# Patient Record
Sex: Female | Born: 1935 | Race: White | Hispanic: No | Smoking: Former smoker
Health system: Southern US, Community
[De-identification: ages and names within clinical notes are randomized; demographics above are authoritative.]

## PROBLEM LIST (undated history)

## (undated) DIAGNOSIS — Z9289 Personal history of other medical treatment: Secondary | ICD-10-CM

## (undated) DIAGNOSIS — I10 Essential (primary) hypertension: Secondary | ICD-10-CM

## (undated) DIAGNOSIS — Z87442 Personal history of urinary calculi: Secondary | ICD-10-CM

## (undated) DIAGNOSIS — F419 Anxiety disorder, unspecified: Secondary | ICD-10-CM

## (undated) DIAGNOSIS — T4145XA Adverse effect of unspecified anesthetic, initial encounter: Secondary | ICD-10-CM

## (undated) DIAGNOSIS — M1990 Unspecified osteoarthritis, unspecified site: Secondary | ICD-10-CM

## (undated) DIAGNOSIS — K589 Irritable bowel syndrome without diarrhea: Secondary | ICD-10-CM

## (undated) DIAGNOSIS — B379 Candidiasis, unspecified: Secondary | ICD-10-CM

## (undated) DIAGNOSIS — M7120 Synovial cyst of popliteal space [Baker], unspecified knee: Secondary | ICD-10-CM

## (undated) DIAGNOSIS — G2581 Restless legs syndrome: Secondary | ICD-10-CM

## (undated) DIAGNOSIS — E7800 Pure hypercholesterolemia, unspecified: Secondary | ICD-10-CM

## (undated) DIAGNOSIS — K219 Gastro-esophageal reflux disease without esophagitis: Secondary | ICD-10-CM

## (undated) DIAGNOSIS — M5126 Other intervertebral disc displacement, lumbar region: Secondary | ICD-10-CM

## (undated) DIAGNOSIS — B370 Candidal stomatitis: Secondary | ICD-10-CM

## (undated) DIAGNOSIS — R51 Headache: Secondary | ICD-10-CM

## (undated) DIAGNOSIS — T8859XA Other complications of anesthesia, initial encounter: Secondary | ICD-10-CM

## (undated) DIAGNOSIS — C801 Malignant (primary) neoplasm, unspecified: Secondary | ICD-10-CM

## (undated) HISTORY — PX: KIDNEY STONE SURGERY: SHX686

## (undated) HISTORY — PX: KNEE ARTHROSCOPY: SHX127

## (undated) HISTORY — PX: CATARACT EXTRACTION W/ INTRAOCULAR LENS  IMPLANT, BILATERAL: SHX1307

## (undated) HISTORY — PX: APPENDECTOMY: SHX54

---
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
## 1970-03-08 HISTORY — PX: ABDOMINAL HYSTERECTOMY: SHX81

## 1970-03-08 HISTORY — PX: OTHER SURGICAL HISTORY: SHX169

## 2006-03-08 HISTORY — PX: BACK SURGERY: SHX140

## 2010-03-08 HISTORY — PX: BREAST SURGERY: SHX581

## 2011-03-10 DIAGNOSIS — M722 Plantar fascial fibromatosis: Secondary | ICD-10-CM | POA: Diagnosis not present

## 2011-03-18 DIAGNOSIS — M722 Plantar fascial fibromatosis: Secondary | ICD-10-CM | POA: Diagnosis not present

## 2011-03-18 DIAGNOSIS — C50919 Malignant neoplasm of unspecified site of unspecified female breast: Secondary | ICD-10-CM | POA: Diagnosis not present

## 2011-03-18 DIAGNOSIS — Z79899 Other long term (current) drug therapy: Secondary | ICD-10-CM | POA: Diagnosis not present

## 2011-03-29 DIAGNOSIS — IMO0002 Reserved for concepts with insufficient information to code with codable children: Secondary | ICD-10-CM | POA: Diagnosis not present

## 2011-03-29 DIAGNOSIS — G603 Idiopathic progressive neuropathy: Secondary | ICD-10-CM | POA: Diagnosis not present

## 2011-03-30 DIAGNOSIS — C50419 Malignant neoplasm of upper-outer quadrant of unspecified female breast: Secondary | ICD-10-CM | POA: Diagnosis not present

## 2011-04-04 DIAGNOSIS — L03119 Cellulitis of unspecified part of limb: Secondary | ICD-10-CM | POA: Diagnosis not present

## 2011-04-06 DIAGNOSIS — L509 Urticaria, unspecified: Secondary | ICD-10-CM | POA: Diagnosis not present

## 2011-04-06 DIAGNOSIS — Z888 Allergy status to other drugs, medicaments and biological substances status: Secondary | ICD-10-CM | POA: Diagnosis not present

## 2011-04-12 DIAGNOSIS — C50419 Malignant neoplasm of upper-outer quadrant of unspecified female breast: Secondary | ICD-10-CM | POA: Diagnosis not present

## 2011-04-12 DIAGNOSIS — Z5111 Encounter for antineoplastic chemotherapy: Secondary | ICD-10-CM | POA: Diagnosis not present

## 2011-04-13 DIAGNOSIS — C50419 Malignant neoplasm of upper-outer quadrant of unspecified female breast: Secondary | ICD-10-CM | POA: Diagnosis not present

## 2011-04-13 DIAGNOSIS — Z5111 Encounter for antineoplastic chemotherapy: Secondary | ICD-10-CM | POA: Diagnosis not present

## 2011-04-14 DIAGNOSIS — M25569 Pain in unspecified knee: Secondary | ICD-10-CM | POA: Diagnosis not present

## 2011-04-14 DIAGNOSIS — M7120 Synovial cyst of popliteal space [Baker], unspecified knee: Secondary | ICD-10-CM | POA: Diagnosis not present

## 2011-04-14 DIAGNOSIS — M25469 Effusion, unspecified knee: Secondary | ICD-10-CM | POA: Diagnosis not present

## 2011-04-14 DIAGNOSIS — M1710 Unilateral primary osteoarthritis, unspecified knee: Secondary | ICD-10-CM | POA: Diagnosis not present

## 2011-04-26 DIAGNOSIS — M5137 Other intervertebral disc degeneration, lumbosacral region: Secondary | ICD-10-CM | POA: Diagnosis not present

## 2011-04-29 DIAGNOSIS — Z79899 Other long term (current) drug therapy: Secondary | ICD-10-CM | POA: Diagnosis not present

## 2011-04-29 DIAGNOSIS — M722 Plantar fascial fibromatosis: Secondary | ICD-10-CM | POA: Diagnosis not present

## 2011-05-12 DIAGNOSIS — IMO0002 Reserved for concepts with insufficient information to code with codable children: Secondary | ICD-10-CM | POA: Diagnosis not present

## 2011-05-12 DIAGNOSIS — G603 Idiopathic progressive neuropathy: Secondary | ICD-10-CM | POA: Diagnosis not present

## 2011-05-26 DIAGNOSIS — M25469 Effusion, unspecified knee: Secondary | ICD-10-CM | POA: Diagnosis not present

## 2011-05-26 DIAGNOSIS — M25569 Pain in unspecified knee: Secondary | ICD-10-CM | POA: Diagnosis not present

## 2011-05-26 DIAGNOSIS — M1710 Unilateral primary osteoarthritis, unspecified knee: Secondary | ICD-10-CM | POA: Diagnosis not present

## 2011-05-26 DIAGNOSIS — M7120 Synovial cyst of popliteal space [Baker], unspecified knee: Secondary | ICD-10-CM | POA: Diagnosis not present

## 2011-06-03 DIAGNOSIS — Z79899 Other long term (current) drug therapy: Secondary | ICD-10-CM | POA: Diagnosis not present

## 2011-06-03 DIAGNOSIS — E785 Hyperlipidemia, unspecified: Secondary | ICD-10-CM | POA: Diagnosis not present

## 2011-06-03 DIAGNOSIS — I10 Essential (primary) hypertension: Secondary | ICD-10-CM | POA: Diagnosis not present

## 2011-06-15 DIAGNOSIS — M48061 Spinal stenosis, lumbar region without neurogenic claudication: Secondary | ICD-10-CM | POA: Diagnosis not present

## 2011-06-19 DIAGNOSIS — M5126 Other intervertebral disc displacement, lumbar region: Secondary | ICD-10-CM | POA: Diagnosis not present

## 2011-06-19 DIAGNOSIS — M47817 Spondylosis without myelopathy or radiculopathy, lumbosacral region: Secondary | ICD-10-CM | POA: Diagnosis not present

## 2011-06-22 DIAGNOSIS — M48061 Spinal stenosis, lumbar region without neurogenic claudication: Secondary | ICD-10-CM | POA: Diagnosis not present

## 2011-06-25 DIAGNOSIS — M5126 Other intervertebral disc displacement, lumbar region: Secondary | ICD-10-CM | POA: Diagnosis not present

## 2011-06-25 DIAGNOSIS — IMO0002 Reserved for concepts with insufficient information to code with codable children: Secondary | ICD-10-CM | POA: Diagnosis not present

## 2011-07-07 DIAGNOSIS — IMO0002 Reserved for concepts with insufficient information to code with codable children: Secondary | ICD-10-CM | POA: Diagnosis not present

## 2011-07-07 DIAGNOSIS — G603 Idiopathic progressive neuropathy: Secondary | ICD-10-CM | POA: Diagnosis not present

## 2011-07-12 DIAGNOSIS — IMO0002 Reserved for concepts with insufficient information to code with codable children: Secondary | ICD-10-CM | POA: Diagnosis not present

## 2011-07-12 DIAGNOSIS — M5126 Other intervertebral disc displacement, lumbar region: Secondary | ICD-10-CM | POA: Diagnosis not present

## 2011-07-30 DIAGNOSIS — M5126 Other intervertebral disc displacement, lumbar region: Secondary | ICD-10-CM | POA: Diagnosis not present

## 2011-08-10 DIAGNOSIS — C50419 Malignant neoplasm of upper-outer quadrant of unspecified female breast: Secondary | ICD-10-CM | POA: Diagnosis not present

## 2011-08-18 DIAGNOSIS — G603 Idiopathic progressive neuropathy: Secondary | ICD-10-CM | POA: Diagnosis not present

## 2011-08-18 DIAGNOSIS — G4700 Insomnia, unspecified: Secondary | ICD-10-CM | POA: Diagnosis not present

## 2011-08-18 DIAGNOSIS — IMO0002 Reserved for concepts with insufficient information to code with codable children: Secondary | ICD-10-CM | POA: Diagnosis not present

## 2011-08-27 DIAGNOSIS — B370 Candidal stomatitis: Secondary | ICD-10-CM | POA: Diagnosis not present

## 2011-08-27 DIAGNOSIS — G4700 Insomnia, unspecified: Secondary | ICD-10-CM | POA: Diagnosis not present

## 2011-09-03 DIAGNOSIS — G4700 Insomnia, unspecified: Secondary | ICD-10-CM | POA: Diagnosis not present

## 2011-09-03 DIAGNOSIS — M5137 Other intervertebral disc degeneration, lumbosacral region: Secondary | ICD-10-CM | POA: Diagnosis not present

## 2011-09-03 DIAGNOSIS — I10 Essential (primary) hypertension: Secondary | ICD-10-CM | POA: Diagnosis not present

## 2011-09-03 DIAGNOSIS — R32 Unspecified urinary incontinence: Secondary | ICD-10-CM | POA: Diagnosis not present

## 2011-09-23 DIAGNOSIS — L02619 Cutaneous abscess of unspecified foot: Secondary | ICD-10-CM | POA: Diagnosis not present

## 2011-09-23 DIAGNOSIS — L97409 Non-pressure chronic ulcer of unspecified heel and midfoot with unspecified severity: Secondary | ICD-10-CM | POA: Diagnosis not present

## 2011-10-05 DIAGNOSIS — L97409 Non-pressure chronic ulcer of unspecified heel and midfoot with unspecified severity: Secondary | ICD-10-CM | POA: Diagnosis not present

## 2011-10-05 DIAGNOSIS — L02619 Cutaneous abscess of unspecified foot: Secondary | ICD-10-CM | POA: Diagnosis not present

## 2011-10-11 DIAGNOSIS — C50419 Malignant neoplasm of upper-outer quadrant of unspecified female breast: Secondary | ICD-10-CM | POA: Diagnosis not present

## 2011-10-20 DIAGNOSIS — M25569 Pain in unspecified knee: Secondary | ICD-10-CM | POA: Diagnosis not present

## 2011-10-20 DIAGNOSIS — M1710 Unilateral primary osteoarthritis, unspecified knee: Secondary | ICD-10-CM | POA: Diagnosis not present

## 2011-10-26 DIAGNOSIS — M5126 Other intervertebral disc displacement, lumbar region: Secondary | ICD-10-CM | POA: Diagnosis not present

## 2011-10-26 DIAGNOSIS — IMO0002 Reserved for concepts with insufficient information to code with codable children: Secondary | ICD-10-CM | POA: Diagnosis not present

## 2011-10-27 DIAGNOSIS — G4700 Insomnia, unspecified: Secondary | ICD-10-CM | POA: Diagnosis not present

## 2011-10-27 DIAGNOSIS — G603 Idiopathic progressive neuropathy: Secondary | ICD-10-CM | POA: Diagnosis not present

## 2011-10-27 DIAGNOSIS — IMO0002 Reserved for concepts with insufficient information to code with codable children: Secondary | ICD-10-CM | POA: Diagnosis not present

## 2011-10-28 DIAGNOSIS — Z79899 Other long term (current) drug therapy: Secondary | ICD-10-CM | POA: Diagnosis not present

## 2011-10-28 DIAGNOSIS — R609 Edema, unspecified: Secondary | ICD-10-CM | POA: Diagnosis not present

## 2011-10-29 DIAGNOSIS — Z79899 Other long term (current) drug therapy: Secondary | ICD-10-CM | POA: Diagnosis not present

## 2011-10-29 DIAGNOSIS — R609 Edema, unspecified: Secondary | ICD-10-CM | POA: Diagnosis not present

## 2011-11-11 DIAGNOSIS — I10 Essential (primary) hypertension: Secondary | ICD-10-CM | POA: Diagnosis not present

## 2011-11-11 DIAGNOSIS — Z79899 Other long term (current) drug therapy: Secondary | ICD-10-CM | POA: Diagnosis not present

## 2011-11-11 DIAGNOSIS — R946 Abnormal results of thyroid function studies: Secondary | ICD-10-CM | POA: Diagnosis not present

## 2011-11-11 DIAGNOSIS — R609 Edema, unspecified: Secondary | ICD-10-CM | POA: Diagnosis not present

## 2011-11-29 DIAGNOSIS — B372 Candidiasis of skin and nail: Secondary | ICD-10-CM | POA: Diagnosis not present

## 2011-11-29 DIAGNOSIS — R946 Abnormal results of thyroid function studies: Secondary | ICD-10-CM | POA: Diagnosis not present

## 2011-11-29 DIAGNOSIS — I10 Essential (primary) hypertension: Secondary | ICD-10-CM | POA: Diagnosis not present

## 2011-11-29 DIAGNOSIS — R609 Edema, unspecified: Secondary | ICD-10-CM | POA: Diagnosis not present

## 2011-12-01 DIAGNOSIS — R269 Unspecified abnormalities of gait and mobility: Secondary | ICD-10-CM | POA: Diagnosis not present

## 2011-12-01 DIAGNOSIS — M1710 Unilateral primary osteoarthritis, unspecified knee: Secondary | ICD-10-CM | POA: Diagnosis not present

## 2011-12-01 DIAGNOSIS — M25569 Pain in unspecified knee: Secondary | ICD-10-CM | POA: Diagnosis not present

## 2011-12-01 DIAGNOSIS — M5137 Other intervertebral disc degeneration, lumbosacral region: Secondary | ICD-10-CM | POA: Diagnosis not present

## 2011-12-02 DIAGNOSIS — R946 Abnormal results of thyroid function studies: Secondary | ICD-10-CM | POA: Diagnosis not present

## 2011-12-06 DIAGNOSIS — Z79899 Other long term (current) drug therapy: Secondary | ICD-10-CM | POA: Diagnosis not present

## 2011-12-06 DIAGNOSIS — L02419 Cutaneous abscess of limb, unspecified: Secondary | ICD-10-CM | POA: Diagnosis not present

## 2011-12-06 DIAGNOSIS — R609 Edema, unspecified: Secondary | ICD-10-CM | POA: Diagnosis not present

## 2011-12-06 DIAGNOSIS — L03119 Cellulitis of unspecified part of limb: Secondary | ICD-10-CM | POA: Diagnosis not present

## 2011-12-06 DIAGNOSIS — G4700 Insomnia, unspecified: Secondary | ICD-10-CM | POA: Diagnosis not present

## 2011-12-06 DIAGNOSIS — I10 Essential (primary) hypertension: Secondary | ICD-10-CM | POA: Diagnosis not present

## 2011-12-06 DIAGNOSIS — R946 Abnormal results of thyroid function studies: Secondary | ICD-10-CM | POA: Diagnosis not present

## 2011-12-14 DIAGNOSIS — C50419 Malignant neoplasm of upper-outer quadrant of unspecified female breast: Secondary | ICD-10-CM | POA: Diagnosis not present

## 2011-12-20 DIAGNOSIS — R1032 Left lower quadrant pain: Secondary | ICD-10-CM | POA: Diagnosis not present

## 2011-12-20 DIAGNOSIS — G4700 Insomnia, unspecified: Secondary | ICD-10-CM | POA: Diagnosis not present

## 2011-12-20 DIAGNOSIS — R609 Edema, unspecified: Secondary | ICD-10-CM | POA: Diagnosis not present

## 2011-12-20 DIAGNOSIS — I10 Essential (primary) hypertension: Secondary | ICD-10-CM | POA: Diagnosis not present

## 2011-12-23 DIAGNOSIS — M1710 Unilateral primary osteoarthritis, unspecified knee: Secondary | ICD-10-CM | POA: Diagnosis not present

## 2011-12-27 DIAGNOSIS — R1032 Left lower quadrant pain: Secondary | ICD-10-CM | POA: Diagnosis not present

## 2011-12-29 DIAGNOSIS — Z23 Encounter for immunization: Secondary | ICD-10-CM | POA: Diagnosis not present

## 2011-12-30 DIAGNOSIS — M1710 Unilateral primary osteoarthritis, unspecified knee: Secondary | ICD-10-CM | POA: Diagnosis not present

## 2012-01-03 DIAGNOSIS — G4700 Insomnia, unspecified: Secondary | ICD-10-CM | POA: Diagnosis not present

## 2012-01-03 DIAGNOSIS — R609 Edema, unspecified: Secondary | ICD-10-CM | POA: Diagnosis not present

## 2012-01-03 DIAGNOSIS — I10 Essential (primary) hypertension: Secondary | ICD-10-CM | POA: Diagnosis not present

## 2012-01-04 DIAGNOSIS — M205X9 Other deformities of toe(s) (acquired), unspecified foot: Secondary | ICD-10-CM | POA: Diagnosis not present

## 2012-01-04 DIAGNOSIS — L97509 Non-pressure chronic ulcer of other part of unspecified foot with unspecified severity: Secondary | ICD-10-CM | POA: Diagnosis not present

## 2012-01-07 DIAGNOSIS — M1710 Unilateral primary osteoarthritis, unspecified knee: Secondary | ICD-10-CM | POA: Diagnosis not present

## 2012-01-10 DIAGNOSIS — M7989 Other specified soft tissue disorders: Secondary | ICD-10-CM | POA: Diagnosis not present

## 2012-01-10 DIAGNOSIS — I872 Venous insufficiency (chronic) (peripheral): Secondary | ICD-10-CM | POA: Diagnosis not present

## 2012-03-21 DIAGNOSIS — I10 Essential (primary) hypertension: Secondary | ICD-10-CM | POA: Diagnosis not present

## 2012-03-21 DIAGNOSIS — M549 Dorsalgia, unspecified: Secondary | ICD-10-CM | POA: Diagnosis not present

## 2012-04-04 DIAGNOSIS — R21 Rash and other nonspecific skin eruption: Secondary | ICD-10-CM | POA: Diagnosis not present

## 2012-04-04 DIAGNOSIS — Z79899 Other long term (current) drug therapy: Secondary | ICD-10-CM | POA: Diagnosis not present

## 2012-04-04 DIAGNOSIS — R609 Edema, unspecified: Secondary | ICD-10-CM | POA: Diagnosis not present

## 2012-04-04 DIAGNOSIS — I10 Essential (primary) hypertension: Secondary | ICD-10-CM | POA: Diagnosis not present

## 2012-04-04 DIAGNOSIS — B370 Candidal stomatitis: Secondary | ICD-10-CM | POA: Diagnosis not present

## 2012-04-04 DIAGNOSIS — M549 Dorsalgia, unspecified: Secondary | ICD-10-CM | POA: Diagnosis not present

## 2012-04-11 DIAGNOSIS — Z853 Personal history of malignant neoplasm of breast: Secondary | ICD-10-CM | POA: Diagnosis not present

## 2012-04-11 DIAGNOSIS — Z09 Encounter for follow-up examination after completed treatment for conditions other than malignant neoplasm: Secondary | ICD-10-CM | POA: Diagnosis not present

## 2012-04-24 DIAGNOSIS — C50419 Malignant neoplasm of upper-outer quadrant of unspecified female breast: Secondary | ICD-10-CM | POA: Diagnosis not present

## 2012-04-26 DIAGNOSIS — G603 Idiopathic progressive neuropathy: Secondary | ICD-10-CM | POA: Diagnosis not present

## 2012-04-26 DIAGNOSIS — G4700 Insomnia, unspecified: Secondary | ICD-10-CM | POA: Diagnosis not present

## 2012-05-04 DIAGNOSIS — S90129A Contusion of unspecified lesser toe(s) without damage to nail, initial encounter: Secondary | ICD-10-CM | POA: Diagnosis not present

## 2012-05-09 DIAGNOSIS — C773 Secondary and unspecified malignant neoplasm of axilla and upper limb lymph nodes: Secondary | ICD-10-CM | POA: Diagnosis not present

## 2012-05-09 DIAGNOSIS — E669 Obesity, unspecified: Secondary | ICD-10-CM | POA: Diagnosis not present

## 2012-05-09 DIAGNOSIS — C801 Malignant (primary) neoplasm, unspecified: Secondary | ICD-10-CM | POA: Diagnosis not present

## 2012-05-16 DIAGNOSIS — E785 Hyperlipidemia, unspecified: Secondary | ICD-10-CM | POA: Diagnosis not present

## 2012-05-16 DIAGNOSIS — M549 Dorsalgia, unspecified: Secondary | ICD-10-CM | POA: Diagnosis not present

## 2012-05-16 DIAGNOSIS — R609 Edema, unspecified: Secondary | ICD-10-CM | POA: Diagnosis not present

## 2012-05-16 DIAGNOSIS — I10 Essential (primary) hypertension: Secondary | ICD-10-CM | POA: Diagnosis not present

## 2012-05-16 DIAGNOSIS — G4700 Insomnia, unspecified: Secondary | ICD-10-CM | POA: Diagnosis not present

## 2012-05-18 DIAGNOSIS — L03039 Cellulitis of unspecified toe: Secondary | ICD-10-CM | POA: Diagnosis not present

## 2012-05-18 DIAGNOSIS — S90129A Contusion of unspecified lesser toe(s) without damage to nail, initial encounter: Secondary | ICD-10-CM | POA: Diagnosis not present

## 2012-06-15 DIAGNOSIS — M5137 Other intervertebral disc degeneration, lumbosacral region: Secondary | ICD-10-CM | POA: Diagnosis not present

## 2012-06-15 DIAGNOSIS — G894 Chronic pain syndrome: Secondary | ICD-10-CM | POA: Diagnosis not present

## 2012-06-15 DIAGNOSIS — M1710 Unilateral primary osteoarthritis, unspecified knee: Secondary | ICD-10-CM | POA: Diagnosis not present

## 2012-06-28 DIAGNOSIS — E7800 Pure hypercholesterolemia, unspecified: Secondary | ICD-10-CM | POA: Diagnosis not present

## 2012-06-28 DIAGNOSIS — R51 Headache: Secondary | ICD-10-CM | POA: Diagnosis not present

## 2012-06-28 DIAGNOSIS — Z79899 Other long term (current) drug therapy: Secondary | ICD-10-CM | POA: Diagnosis not present

## 2012-06-28 DIAGNOSIS — I10 Essential (primary) hypertension: Secondary | ICD-10-CM | POA: Diagnosis not present

## 2012-06-29 DIAGNOSIS — I10 Essential (primary) hypertension: Secondary | ICD-10-CM | POA: Diagnosis not present

## 2012-06-29 DIAGNOSIS — M25569 Pain in unspecified knee: Secondary | ICD-10-CM | POA: Diagnosis not present

## 2012-07-03 DIAGNOSIS — M25569 Pain in unspecified knee: Secondary | ICD-10-CM | POA: Diagnosis not present

## 2012-07-03 DIAGNOSIS — M1710 Unilateral primary osteoarthritis, unspecified knee: Secondary | ICD-10-CM | POA: Diagnosis not present

## 2012-07-13 DIAGNOSIS — M25569 Pain in unspecified knee: Secondary | ICD-10-CM | POA: Diagnosis not present

## 2012-07-13 DIAGNOSIS — I10 Essential (primary) hypertension: Secondary | ICD-10-CM | POA: Diagnosis not present

## 2012-07-18 DIAGNOSIS — G4700 Insomnia, unspecified: Secondary | ICD-10-CM | POA: Diagnosis not present

## 2012-07-18 DIAGNOSIS — G603 Idiopathic progressive neuropathy: Secondary | ICD-10-CM | POA: Diagnosis not present

## 2012-07-18 DIAGNOSIS — IMO0002 Reserved for concepts with insufficient information to code with codable children: Secondary | ICD-10-CM | POA: Diagnosis not present

## 2012-07-19 DIAGNOSIS — M23359 Other meniscus derangements, posterior horn of lateral meniscus, unspecified knee: Secondary | ICD-10-CM | POA: Diagnosis not present

## 2012-07-19 DIAGNOSIS — M23349 Other meniscus derangements, anterior horn of lateral meniscus, unspecified knee: Secondary | ICD-10-CM | POA: Diagnosis not present

## 2012-07-19 DIAGNOSIS — M25569 Pain in unspecified knee: Secondary | ICD-10-CM | POA: Diagnosis not present

## 2012-07-19 DIAGNOSIS — M1710 Unilateral primary osteoarthritis, unspecified knee: Secondary | ICD-10-CM | POA: Diagnosis not present

## 2012-07-26 DIAGNOSIS — I10 Essential (primary) hypertension: Secondary | ICD-10-CM | POA: Diagnosis not present

## 2012-07-26 DIAGNOSIS — M1710 Unilateral primary osteoarthritis, unspecified knee: Secondary | ICD-10-CM | POA: Diagnosis not present

## 2012-08-16 DIAGNOSIS — Z79899 Other long term (current) drug therapy: Secondary | ICD-10-CM | POA: Diagnosis not present

## 2012-08-16 DIAGNOSIS — G4700 Insomnia, unspecified: Secondary | ICD-10-CM | POA: Diagnosis not present

## 2012-08-16 DIAGNOSIS — R079 Chest pain, unspecified: Secondary | ICD-10-CM | POA: Diagnosis not present

## 2012-08-16 DIAGNOSIS — R51 Headache: Secondary | ICD-10-CM | POA: Diagnosis not present

## 2012-08-16 DIAGNOSIS — E785 Hyperlipidemia, unspecified: Secondary | ICD-10-CM | POA: Diagnosis not present

## 2012-08-16 DIAGNOSIS — I10 Essential (primary) hypertension: Secondary | ICD-10-CM | POA: Diagnosis not present

## 2012-08-25 DIAGNOSIS — R42 Dizziness and giddiness: Secondary | ICD-10-CM | POA: Diagnosis not present

## 2012-08-25 DIAGNOSIS — R002 Palpitations: Secondary | ICD-10-CM | POA: Diagnosis not present

## 2012-08-25 DIAGNOSIS — R079 Chest pain, unspecified: Secondary | ICD-10-CM | POA: Diagnosis not present

## 2012-08-25 DIAGNOSIS — I10 Essential (primary) hypertension: Secondary | ICD-10-CM | POA: Diagnosis not present

## 2012-08-25 DIAGNOSIS — R0602 Shortness of breath: Secondary | ICD-10-CM | POA: Diagnosis not present

## 2012-08-25 DIAGNOSIS — Z79899 Other long term (current) drug therapy: Secondary | ICD-10-CM | POA: Diagnosis not present

## 2012-08-25 DIAGNOSIS — R51 Headache: Secondary | ICD-10-CM | POA: Diagnosis not present

## 2012-09-14 DIAGNOSIS — K219 Gastro-esophageal reflux disease without esophagitis: Secondary | ICD-10-CM | POA: Diagnosis not present

## 2012-09-14 DIAGNOSIS — M1710 Unilateral primary osteoarthritis, unspecified knee: Secondary | ICD-10-CM | POA: Diagnosis not present

## 2012-09-14 DIAGNOSIS — I10 Essential (primary) hypertension: Secondary | ICD-10-CM | POA: Diagnosis not present

## 2012-09-14 DIAGNOSIS — R300 Dysuria: Secondary | ICD-10-CM | POA: Diagnosis not present

## 2012-09-23 DIAGNOSIS — M1710 Unilateral primary osteoarthritis, unspecified knee: Secondary | ICD-10-CM | POA: Diagnosis not present

## 2012-09-23 DIAGNOSIS — M48061 Spinal stenosis, lumbar region without neurogenic claudication: Secondary | ICD-10-CM | POA: Diagnosis not present

## 2012-10-03 DIAGNOSIS — Z01810 Encounter for preprocedural cardiovascular examination: Secondary | ICD-10-CM | POA: Diagnosis not present

## 2012-10-03 DIAGNOSIS — I10 Essential (primary) hypertension: Secondary | ICD-10-CM | POA: Diagnosis not present

## 2012-10-09 DIAGNOSIS — Z01810 Encounter for preprocedural cardiovascular examination: Secondary | ICD-10-CM | POA: Diagnosis not present

## 2012-10-09 DIAGNOSIS — R079 Chest pain, unspecified: Secondary | ICD-10-CM | POA: Diagnosis not present

## 2012-10-18 DIAGNOSIS — M1710 Unilateral primary osteoarthritis, unspecified knee: Secondary | ICD-10-CM | POA: Diagnosis not present

## 2012-10-19 ENCOUNTER — Other Ambulatory Visit (HOSPITAL_COMMUNITY): Payer: Self-pay | Admitting: Orthopedic Surgery

## 2012-10-19 NOTE — Patient Instructions (Addendum)
[address] Lus Tangney  10/19/2012   Your procedure is scheduled on: 10-27-2012  Report to [facility] at 700 AM.  Call this number if you have problems the morning of surgery [phone]   Remember:   Do not eat food or drink liquids :After Midnight.     Take these medicines the morning of surgery with A SIP OF WATER: carvedilol, gabapentin, vicodin if needed, prevacid, pravastatin                    Do not wear jewelry, make-up or nail polish.  Do not wear lotions, powders, or perfumes. You may wear deodorant.   Men may shave face and neck.  Do not bring valuables to the hospital.   Contacts, dentures or bridgework may not be worn into surgery.  Leave suitcase in the car. After surgery it may be brought to your room.  For patients admitted to the hospital, checkout time is 11:00 AM the day of discharge.    Please read over the following fact sheets that you were given: MRSA Information, BLOOD FACT SHEET, INCENTIVE SPIROMETER FACT SHEET  Call Birdie Sons RN pre op nurse if needed [phone]    FAILURE TO FOLLOW THESE INSTRUCTIONS MAY RESULT IN THE CANCELLATION OF YOUR SURGERY.  PATIENT SIGNATURE___________________________________________  NURSE SIGNATURE_____________________________________________

## 2012-10-19 NOTE — Progress Notes (Signed)
08-25-12 [facility] ON CHART MEDICAL CLEARANCE NOTE DR CAROLINE PROCHNAU ON CHART STRESS TEST 10-09-12 [facility] ON CHART EKG 08-25-12 [facility] ON CHART

## 2012-10-20 ENCOUNTER — Encounter (HOSPITAL_COMMUNITY): Payer: Self-pay

## 2012-10-20 ENCOUNTER — Encounter (HOSPITAL_COMMUNITY): Payer: Self-pay | Admitting: Pharmacy Technician

## 2012-10-20 ENCOUNTER — Encounter (HOSPITAL_COMMUNITY)
Admission: RE | Admit: 2012-10-20 | Discharge: 2012-10-20 | Disposition: A | Discharge status: Home / Self Care | Payer: Medicare Other | Source: Ambulatory Visit | Attending: Orthopedic Surgery | Admitting: Orthopedic Surgery

## 2012-10-20 DIAGNOSIS — M1710 Unilateral primary osteoarthritis, unspecified knee: Secondary | ICD-10-CM | POA: Insufficient documentation

## 2012-10-20 DIAGNOSIS — Z01812 Encounter for preprocedural laboratory examination: Secondary | ICD-10-CM | POA: Diagnosis not present

## 2012-10-20 DIAGNOSIS — Z0183 Encounter for blood typing: Secondary | ICD-10-CM | POA: Insufficient documentation

## 2012-10-20 HISTORY — DX: Personal history of urinary calculi: Z87.442

## 2012-10-20 HISTORY — DX: Other intervertebral disc displacement, lumbar region: M51.26

## 2012-10-20 HISTORY — DX: Headache: R51

## 2012-10-20 HISTORY — DX: Candidiasis, unspecified: B37.9

## 2012-10-20 HISTORY — DX: Essential (primary) hypertension: I10

## 2012-10-20 HISTORY — DX: Pure hypercholesterolemia, unspecified: E78.00

## 2012-10-20 HISTORY — DX: Synovial cyst of popliteal space (Baker), unspecified knee: M71.20

## 2012-10-20 HISTORY — DX: Restless legs syndrome: G25.81

## 2012-10-20 HISTORY — DX: Other complications of anesthesia, initial encounter: T88.59XA

## 2012-10-20 HISTORY — DX: Gastro-esophageal reflux disease without esophagitis: K21.9

## 2012-10-20 HISTORY — DX: Unspecified osteoarthritis, unspecified site: M19.90

## 2012-10-20 HISTORY — DX: Malignant (primary) neoplasm, unspecified: C80.1

## 2012-10-20 HISTORY — DX: Anxiety disorder, unspecified: F41.9

## 2012-10-20 HISTORY — DX: Candidal stomatitis: B37.0

## 2012-10-20 HISTORY — DX: Adverse effect of unspecified anesthetic, initial encounter: T41.45XA

## 2012-10-20 LAB — COMPREHENSIVE METABOLIC PANEL WITH GFR
ALT: 18 U/L (ref 0–35)
AST: 18 U/L (ref 0–37)
Albumin: 4 g/dL (ref 3.5–5.2)
Alkaline Phosphatase: 56 U/L (ref 39–117)
BUN: 9 mg/dL (ref 6–23)
CO2: 27 meq/L (ref 19–32)
Calcium: 9.9 mg/dL (ref 8.4–10.5)
Chloride: 103 meq/L (ref 96–112)
Creatinine, Ser: 0.64 mg/dL (ref 0.50–1.10)
GFR calc Af Amer: >90 mL/min
GFR calc non Af Amer: 81 mL/min — ABNORMAL LOW
Glucose, Bld: 108 mg/dL — ABNORMAL HIGH (ref 70–99)
Potassium: 3.1 meq/L — ABNORMAL LOW (ref 3.5–5.1)
Sodium: 144 meq/L (ref 135–145)
Total Bilirubin: 0.3 mg/dL (ref 0.3–1.2)
Total Protein: 7.6 g/dL (ref 6.0–8.3)

## 2012-10-20 LAB — URINALYSIS, ROUTINE W REFLEX MICROSCOPIC
Bilirubin Urine: NEGATIVE
Glucose, UA: NEGATIVE mg/dL
Hgb urine dipstick: NEGATIVE
Ketones, ur: NEGATIVE mg/dL
Leukocytes, UA: NEGATIVE
Nitrite: NEGATIVE
Protein, ur: NEGATIVE mg/dL
Specific Gravity, Urine: 1.011 (ref 1.005–1.030)
Urobilinogen, UA: 0.2 mg/dL (ref 0.0–1.0)
pH: 7 (ref 5.0–8.0)

## 2012-10-20 LAB — CBC
Hemoglobin: 12.4 g/dL (ref 12.0–15.0)
MCH: 29.4 pg (ref 26.0–34.0)
MCHC: 32.2 g/dL (ref 30.0–36.0)
MCV: 91.2 fL (ref 78.0–100.0)
RBC: 4.22 MIL/uL (ref 3.87–5.11)

## 2012-10-20 LAB — PROTIME-INR
INR: 0.99 (ref 0.00–1.49)
Prothrombin Time: 12.9 seconds (ref 11.6–15.2)

## 2012-10-20 LAB — APTT: aPTT: 29 seconds (ref 24–37)

## 2012-10-20 LAB — ABO/RH: ABO/RH(D): O POS

## 2012-10-22 NOTE — H&P (Signed)
TOTAL KNEE ADMISSION H&P  Patient is being admitted for left total knee arthroplasty.  Subjective:  Chief Complaint:left knee pain.  HPI: Megan Page, 79 y.o. female, has a history of pain and functional disability in the left knee due to arthritis and has failed non-surgical conservative treatments for greater than 12 weeks to includeNSAID's and/or analgesics, corticosteriod injections, viscosupplementation injections, use of assistive devices and activity modification.  Onset of symptoms was gradual, starting 6 years ago with gradually worsening course since that time. The patient noted prior procedures on the knee to include  arthroscopy and menisectomy on the left knee(s).  Patient currently rates pain in the left knee(s) at 6 out of 10 with activity. Patient has night pain, worsening of pain with activity and weight bearing, pain that interferes with activities of daily living, pain with passive range of motion, crepitus and joint swelling.  Patient has evidence of periarticular osteophytes and joint space narrowing by imaging studies.  There is no active infection.   Past Medical History  Diagnosis Date  . Hypertension   . Hypercholesteremia   . Anxiety     just for surgery  . GERD (gastroesophageal reflux disease)     from medication  . Headache(784.0)   . Cancer     left breast cancer  . Arthritis     back, knees  . Ruptured lumbar disc   . History of kidney stones   . Thrush     hx of from antibiotics  . Yeast infection     hx of because of antibiotics  . Baker's cyst of knee     left knee  . Complication of anesthesia     wakes up really fast, sometimes close to end of surgery  . Restless leg syndrome     medicine controlled    Past Surgical History  Procedure Laterality Date  . Breast surgery Left 2012    lumpectomy  . Kidney stone surgery      removed with stent placement, infection after surgery  . Goiter removed  1972  . Appendectomy  at age 8  .  Abdominal hysterectomy  1972  . Back surgery  2008    "XSTOP implanted in lower back"  . Tonsillectomy  1937  . Knee arthroscopy Left 5 or 6 years ago  . Cataract extraction w/ intraocular lens  implant, bilateral      Current outpatient prescriptions: ammonium lactate (AMLACTIN) 12 % cream, Apply 5 g topically 2 (two) times daily. Lac-hydrin, Disp: , Rfl: ;  anastrozole (ARIMIDEX) 1 MG tablet, Take 1 mg by mouth daily., Disp: , Rfl: ;  calcium carbonate (OS-CAL - DOSED IN MG OF ELEMENTAL CALCIUM) 1250 MG tablet, Take 1 tablet by mouth., Disp: , Rfl: ;  carvedilol (COREG) 25 MG tablet, Take 25 mg by mouth 2 (two) times daily with a meal., Disp: , Rfl:  cholecalciferol (VITAMIN D) 1000 UNITS tablet, Take 1,000 Units by mouth daily., Disp: , Rfl: ;  diphenhydrAMINE (BENADRYL) 25 mg capsule, Take 25 mg by mouth at bedtime as needed for allergies., Disp: , Rfl: ;  fluticasone (FLONASE) 50 MCG/ACT nasal spray, Place 1 spray into the nose daily as needed for allergies., Disp: , Rfl:  gabapentin (NEURONTIN) 100 MG capsule, Take 300 mg by mouth 3 (three) times daily. Can take up to 9 capsules daily, Disp: , Rfl: ;  hydrALAZINE (APRESOLINE) 50 MG tablet, Take 50-100 mg by mouth 3 (three) times daily. Take one in the am, one at  noon, and two at betime, Disp: , Rfl: ;  HYDROcodone-acetaminophen (NORCO/VICODIN) 5-325 MG per tablet, Take 1 tablet by mouth every 6 (six) hours as needed for pain., Disp: , Rfl:  lansoprazole (PREVACID) 15 MG capsule, Take 15 mg by mouth daily., Disp: , Rfl: ;  losartan (COZAAR) 100 MG tablet, Take 100 mg by mouth every morning., Disp: , Rfl: ;  oxymetazoline (AFRIN) 0.05 % nasal spray, Place 2 sprays into the nose daily as needed for congestion., Disp: , Rfl: ;  pravastatin (PRAVACHOL) 20 MG tablet, Take 20 mg by mouth daily., Disp: , Rfl: ;  solifenacin (VESICARE) 5 MG tablet, Take 10 mg by mouth daily., Disp: , Rfl:  triamcinolone (KENALOG) 0.025 % cream, Apply 1 application topically  2 (two) times daily as needed. Apply to legs, Disp: , Rfl: ;  zolpidem (AMBIEN) 10 MG tablet, Take 10 mg by mouth at bedtime as needed for sleep., Disp: , Rfl:   Allergies  Allergen Reactions  . Celebrex [Celecoxib] Shortness Of Breath and Rash  . Sulfa Antibiotics Rash  . Aspirin     Affected platelets   . Macrolides And Ketolides Hives    History  Substance Use Topics  . Smoking status: Former Smoker -- 0.25 packs/day    Types: Cigarettes    Quit date: 03/08/1978  . Smokeless tobacco: Never Used     Comment: social smoker  . Alcohol Use: No      Review of Systems  Constitutional: Negative.   HENT: Negative for hearing loss, ear pain, nosebleeds, congestion, sore throat, neck pain, tinnitus and ear discharge.   Eyes: Negative.   Respiratory: Positive for shortness of breath. Negative for cough, hemoptysis, sputum production, wheezing and stridor.        SOB with exertion  Cardiovascular: Negative.   Gastrointestinal: Positive for constipation. Negative for heartburn, nausea, vomiting, abdominal pain, diarrhea, blood in stool and melena.  Genitourinary: Positive for frequency. Negative for dysuria, urgency, hematuria and flank pain.  Musculoskeletal: Positive for back pain and joint pain. Negative for myalgias and falls.  Skin: Positive for rash. Negative for itching.  Neurological: Positive for headaches. Negative for dizziness, tingling, tremors, sensory change, speech change, focal weakness, seizures and loss of consciousness.  Endo/Heme/Allergies: Negative.   Psychiatric/Behavioral: Negative for depression, suicidal ideas, hallucinations, memory loss and substance abuse. The patient has insomnia. The patient is not nervous/anxious.     Objective:  Physical Exam  Constitutional: She is oriented to person, place, and time. She appears well-developed and well-nourished. No distress.  HENT:  Head: Normocephalic and atraumatic.  Right Ear: External ear normal.  Left Ear:  External ear normal.  Nose: Nose normal.  Mouth/Throat: Oropharynx is clear and moist.  Eyes: Conjunctivae and EOM are normal.  Neck: Normal range of motion. Neck supple.  Cardiovascular: Normal rate, regular rhythm, normal heart sounds and intact distal pulses.   No murmur heard. Respiratory: Effort normal and breath sounds normal. No respiratory distress. She has no wheezes.  GI: Soft. Bowel sounds are normal. She exhibits no distension and no mass. There is no tenderness.  Musculoskeletal:       Right hip: Normal.       Left hip: Normal.       Right knee: She exhibits decreased range of motion. She exhibits no swelling, no effusion and no erythema. Tenderness found. Medial joint line and lateral joint line tenderness noted.       Left knee: She exhibits decreased range of motion and swelling.  She exhibits no effusion and no erythema. Tenderness found. Medial joint line and lateral joint line tenderness noted.       Lumbar back: She exhibits decreased range of motion and pain.       Right lower leg: She exhibits no tenderness and no swelling.       Left lower leg: She exhibits no tenderness and no swelling.  Lymphadenopathy:    She has no cervical adenopathy.  Neurological: She is alert and oriented to person, place, and time. She has normal strength and normal reflexes. No sensory deficit.  Skin: No rash noted. She is not diaphoretic. No erythema.  Psychiatric: She has a normal mood and affect. Her behavior is normal.    Vitals  HR: 76 bpm BP: 150/85 (Sitting, Left Arm, Standard)  Imaging Review Plain radiographs demonstrate severe degenerative joint disease of the left knee(s). The overall alignment ismild valgus. The bone quality appears to be fair for age and reported activity level.  Assessment/Plan:  End stage arthritis, left knee   The patient history, physical examination, clinical judgment of the provider and imaging studies are consistent with end stage degenerative  joint disease of the left knee(s) and total knee arthroplasty is deemed medically necessary. The treatment options including medical management, injection therapy arthroscopy and arthroplasty were discussed at length. The risks and benefits of total knee arthroplasty were presented and reviewed. The risks due to aseptic loosening, infection, stiffness, patella tracking problems, thromboembolic complications and other imponderables were discussed. The patient acknowledged the explanation, agreed to proceed with the plan and consent was signed. Patient is being admitted for inpatient treatment for surgery, pain control, PT, OT, prophylactic antibiotics, VTE prophylaxis, progressive ambulation and ADL's and discharge planning. The patient is planning to be discharged to skilled nursing facility ([facility])   Dimitri Ped, [place]

## 2012-10-23 DIAGNOSIS — Z09 Encounter for follow-up examination after completed treatment for conditions other than malignant neoplasm: Secondary | ICD-10-CM | POA: Diagnosis not present

## 2012-10-23 DIAGNOSIS — Z853 Personal history of malignant neoplasm of breast: Secondary | ICD-10-CM | POA: Diagnosis not present

## 2012-10-27 ENCOUNTER — Encounter (HOSPITAL_COMMUNITY)
Admission: RE | Disposition: A | Discharge status: Skilled Nursing Facility | Payer: Self-pay | Source: Ambulatory Visit | Attending: Orthopedic Surgery

## 2012-10-27 ENCOUNTER — Encounter (HOSPITAL_COMMUNITY): Payer: Self-pay | Admitting: *Deleted

## 2012-10-27 ENCOUNTER — Inpatient Hospital Stay (HOSPITAL_COMMUNITY): Payer: Medicare Other

## 2012-10-27 ENCOUNTER — Encounter (HOSPITAL_COMMUNITY): Payer: Self-pay | Admitting: Anesthesiology

## 2012-10-27 ENCOUNTER — Inpatient Hospital Stay (HOSPITAL_COMMUNITY): Payer: Medicare Other | Admitting: Anesthesiology

## 2012-10-27 ENCOUNTER — Inpatient Hospital Stay (HOSPITAL_COMMUNITY)
Admission: RE | Admit: 2012-10-27 | Discharge: 2012-10-31 | DRG: 470 | Disposition: A | Discharge status: Skilled Nursing Facility | Payer: Medicare Other | Source: Ambulatory Visit | Attending: Orthopedic Surgery | Admitting: Orthopedic Surgery

## 2012-10-27 DIAGNOSIS — Z87891 Personal history of nicotine dependence: Secondary | ICD-10-CM | POA: Diagnosis not present

## 2012-10-27 DIAGNOSIS — I10 Essential (primary) hypertension: Secondary | ICD-10-CM | POA: Diagnosis present

## 2012-10-27 DIAGNOSIS — Z01812 Encounter for preprocedural laboratory examination: Secondary | ICD-10-CM | POA: Diagnosis not present

## 2012-10-27 DIAGNOSIS — G2581 Restless legs syndrome: Secondary | ICD-10-CM | POA: Diagnosis present

## 2012-10-27 DIAGNOSIS — D62 Acute posthemorrhagic anemia: Secondary | ICD-10-CM | POA: Diagnosis not present

## 2012-10-27 DIAGNOSIS — M1710 Unilateral primary osteoarthritis, unspecified knee: Principal | ICD-10-CM | POA: Diagnosis present

## 2012-10-27 DIAGNOSIS — Z79899 Other long term (current) drug therapy: Secondary | ICD-10-CM

## 2012-10-27 DIAGNOSIS — IMO0002 Reserved for concepts with insufficient information to code with codable children: Secondary | ICD-10-CM | POA: Diagnosis not present

## 2012-10-27 DIAGNOSIS — F411 Generalized anxiety disorder: Secondary | ICD-10-CM | POA: Diagnosis present

## 2012-10-27 DIAGNOSIS — E7800 Pure hypercholesterolemia, unspecified: Secondary | ICD-10-CM | POA: Diagnosis not present

## 2012-10-27 DIAGNOSIS — M24569 Contracture, unspecified knee: Secondary | ICD-10-CM | POA: Diagnosis not present

## 2012-10-27 DIAGNOSIS — M1712 Unilateral primary osteoarthritis, left knee: Secondary | ICD-10-CM

## 2012-10-27 DIAGNOSIS — M25569 Pain in unspecified knee: Secondary | ICD-10-CM | POA: Diagnosis not present

## 2012-10-27 DIAGNOSIS — K219 Gastro-esophageal reflux disease without esophagitis: Secondary | ICD-10-CM | POA: Diagnosis present

## 2012-10-27 DIAGNOSIS — Z471 Aftercare following joint replacement surgery: Secondary | ICD-10-CM | POA: Diagnosis not present

## 2012-10-27 DIAGNOSIS — Z96659 Presence of unspecified artificial knee joint: Secondary | ICD-10-CM | POA: Diagnosis not present

## 2012-10-27 DIAGNOSIS — S8990XA Unspecified injury of unspecified lower leg, initial encounter: Secondary | ICD-10-CM | POA: Diagnosis not present

## 2012-10-27 DIAGNOSIS — Y831 Surgical operation with implant of artificial internal device as the cause of abnormal reaction of the patient, or of later complication, without mention of misadventure at the time of the procedure: Secondary | ICD-10-CM | POA: Diagnosis not present

## 2012-10-27 HISTORY — PX: TOTAL KNEE ARTHROPLASTY: SHX125

## 2012-10-27 SURGERY — ARTHROPLASTY, KNEE, TOTAL
Anesthesia: General | Site: Knee | Laterality: Left | Wound class: Clean

## 2012-10-27 MED ORDER — PROMETHAZINE HCL 25 MG/ML IJ SOLN: 6.2500 mg | INTRAMUSCULAR | Status: DC | PRN

## 2012-10-27 MED ORDER — HYDROMORPHONE HCL 2 MG PO TABS
2.0000 mg | ORAL_TABLET | ORAL | Status: DC | PRN
  Administered 2012-10-27 – 2012-10-29 (×11): 2 mg via ORAL
  Filled 2012-10-27 (×12): qty 1

## 2012-10-27 MED ORDER — HYDROMORPHONE HCL PF 1 MG/ML IJ SOLN
1.0000 mg | INTRAMUSCULAR | Status: DC | PRN
  Administered 2012-10-27 (×2): 1 mg via INTRAVENOUS
  Filled 2012-10-27 (×2): qty 1

## 2012-10-27 MED ORDER — FLEET ENEMA 7-19 GM/118ML RE ENEM
1.0000 | ENEMA | Freq: Once | RECTAL | Status: AC | PRN
  Filled 2012-10-27: qty 1

## 2012-10-27 MED ORDER — HYDRALAZINE HCL 50 MG PO TABS
50.0000 mg | ORAL_TABLET | Freq: Two times a day (BID) | ORAL | Status: DC
  Filled 2012-10-27 (×2): qty 1

## 2012-10-27 MED ORDER — SODIUM CHLORIDE 0.9 % IJ SOLN
INTRAMUSCULAR | Status: DC | PRN
  Administered 2012-10-27: 11:00:00

## 2012-10-27 MED ORDER — THROMBIN 5000 UNITS EX SOLR
CUTANEOUS | Status: DC | PRN
  Administered 2012-10-27: 10000 [IU] via TOPICAL

## 2012-10-27 MED ORDER — HYDROCODONE-ACETAMINOPHEN 5-325 MG PO TABS
1.0000 | ORAL_TABLET | ORAL | Status: DC | PRN
  Administered 2012-10-30 – 2012-10-31 (×6): 1 via ORAL
  Filled 2012-10-27 (×7): qty 1

## 2012-10-27 MED ORDER — SIMVASTATIN 5 MG PO TABS
5.0000 mg | ORAL_TABLET | Freq: Every day | ORAL | Status: DC
  Administered 2012-10-27 – 2012-10-30 (×4): 5 mg via ORAL
  Filled 2012-10-27 (×6): qty 1

## 2012-10-27 MED ORDER — CEFAZOLIN SODIUM-DEXTROSE 2-3 GM-% IV SOLR
2.0000 g | INTRAVENOUS | Status: AC
  Administered 2012-10-27: 2 g via INTRAVENOUS

## 2012-10-27 MED ORDER — ONDANSETRON HCL 4 MG PO TABS
4.0000 mg | ORAL_TABLET | Freq: Four times a day (QID) | ORAL | Status: DC | PRN
  Administered 2012-10-28: 4 mg via ORAL
  Filled 2012-10-27 (×3): qty 1

## 2012-10-27 MED ORDER — ONDANSETRON HCL 4 MG/2ML IJ SOLN
4.0000 mg | Freq: Four times a day (QID) | INTRAMUSCULAR | Status: DC | PRN
  Administered 2012-10-28 – 2012-10-31 (×2): 4 mg via INTRAVENOUS
  Filled 2012-10-27: qty 2

## 2012-10-27 MED ORDER — HYDROMORPHONE HCL PF 1 MG/ML IJ SOLN
INTRAMUSCULAR | Status: DC | PRN
  Administered 2012-10-27 (×2): 1 mg via INTRAVENOUS

## 2012-10-27 MED ORDER — ONDANSETRON HCL 4 MG/2ML IJ SOLN
INTRAMUSCULAR | Status: DC | PRN
  Administered 2012-10-27: 4 mg via INTRAVENOUS

## 2012-10-27 MED ORDER — ANASTROZOLE 1 MG PO TABS
1.0000 mg | ORAL_TABLET | Freq: Every day | ORAL | Status: DC
  Administered 2012-10-27 – 2012-10-31 (×5): 1 mg via ORAL
  Filled 2012-10-27 (×7): qty 1

## 2012-10-27 MED ORDER — PROPOFOL 10 MG/ML IV BOLUS
INTRAVENOUS | Status: DC | PRN
  Administered 2012-10-27: 100 mg via INTRAVENOUS

## 2012-10-27 MED ORDER — ACETAMINOPHEN 650 MG RE SUPP
650.0000 mg | Freq: Four times a day (QID) | RECTAL | Status: DC | PRN
  Filled 2012-10-27: qty 1

## 2012-10-27 MED ORDER — LACTATED RINGERS IV SOLN
INTRAVENOUS | Status: DC
  Administered 2012-10-27 (×2): via INTRAVENOUS

## 2012-10-27 MED ORDER — GLYCOPYRROLATE 0.2 MG/ML IJ SOLN
INTRAMUSCULAR | Status: DC | PRN
  Administered 2012-10-27: .4 mg via INTRAVENOUS

## 2012-10-27 MED ORDER — FERROUS SULFATE 325 (65 FE) MG PO TABS
325.0000 mg | ORAL_TABLET | Freq: Three times a day (TID) | ORAL | Status: DC
  Administered 2012-10-28 – 2012-10-31 (×10): 325 mg via ORAL
  Filled 2012-10-27 (×14): qty 1

## 2012-10-27 MED ORDER — BISACODYL 10 MG RE SUPP
10.0000 mg | Freq: Every day | RECTAL | Status: DC | PRN
  Administered 2012-10-31: 10 mg via RECTAL
  Filled 2012-10-27 (×3): qty 1

## 2012-10-27 MED ORDER — RIVAROXABAN 10 MG PO TABS
10.0000 mg | ORAL_TABLET | Freq: Every day | ORAL | Status: DC
Start: 2012-10-28 — End: 2012-10-31
  Administered 2012-10-28 – 2012-10-31 (×4): 10 mg via ORAL
  Filled 2012-10-27 (×5): qty 1

## 2012-10-27 MED ORDER — CARVEDILOL 25 MG PO TABS
25.0000 mg | ORAL_TABLET | Freq: Two times a day (BID) | ORAL | Status: DC
  Administered 2012-10-27 – 2012-10-31 (×6): 25 mg via ORAL
  Filled 2012-10-27 (×11): qty 1

## 2012-10-27 MED ORDER — HYDROMORPHONE HCL PF 1 MG/ML IJ SOLN
0.2500 mg | INTRAMUSCULAR | Status: DC | PRN
  Administered 2012-10-27 (×2): 0.5 mg via INTRAVENOUS

## 2012-10-27 MED ORDER — FENTANYL CITRATE 0.05 MG/ML IJ SOLN
25.0000 ug | INTRAMUSCULAR | Status: DC | PRN
  Administered 2012-10-27 (×3): 50 ug via INTRAVENOUS

## 2012-10-27 MED ORDER — LABETALOL HCL 5 MG/ML IV SOLN
INTRAVENOUS | Status: DC | PRN
  Administered 2012-10-27 (×2): 5 mg via INTRAVENOUS

## 2012-10-27 MED ORDER — DEXAMETHASONE SODIUM PHOSPHATE 10 MG/ML IJ SOLN
INTRAMUSCULAR | Status: DC | PRN
  Administered 2012-10-27: 10 mg via INTRAVENOUS

## 2012-10-27 MED ORDER — LACTATED RINGERS IV SOLN
INTRAVENOUS | Status: DC
  Administered 2012-10-27: 100 mL/h via INTRAVENOUS
  Administered 2012-10-28: 01:00:00 via INTRAVENOUS

## 2012-10-27 MED ORDER — METOCLOPRAMIDE HCL 5 MG/ML IJ SOLN
5.0000 mg | Freq: Four times a day (QID) | INTRAMUSCULAR | Status: DC | PRN
  Administered 2012-10-27 – 2012-10-28 (×2): 10 mg via INTRAVENOUS
  Filled 2012-10-27 (×2): qty 2

## 2012-10-27 MED ORDER — DARIFENACIN HYDROBROMIDE ER 7.5 MG PO TB24
7.5000 mg | ORAL_TABLET | Freq: Every day | ORAL | Status: DC
  Administered 2012-10-29 – 2012-10-31 (×3): 7.5 mg via ORAL
  Filled 2012-10-27 (×6): qty 1

## 2012-10-27 MED ORDER — GABAPENTIN 300 MG PO CAPS
300.0000 mg | ORAL_CAPSULE | Freq: Three times a day (TID) | ORAL | Status: DC
  Administered 2012-10-27 – 2012-10-31 (×12): 300 mg via ORAL
  Filled 2012-10-27 (×15): qty 1

## 2012-10-27 MED ORDER — DEXTROSE 5 % IV SOLN
500.0000 mg | Freq: Four times a day (QID) | INTRAVENOUS | Status: DC | PRN
  Administered 2012-10-27: 500 mg via INTRAVENOUS
  Filled 2012-10-27: qty 5

## 2012-10-27 MED ORDER — MENTHOL 3 MG MT LOZG
1.0000 | LOZENGE | OROMUCOSAL | Status: DC | PRN
  Administered 2012-10-27: 3 mg via ORAL
  Filled 2012-10-27: qty 9

## 2012-10-27 MED ORDER — CEFAZOLIN SODIUM 1-5 GM-% IV SOLN
1.0000 g | Freq: Four times a day (QID) | INTRAVENOUS | Status: AC
Start: 2012-10-27 — End: 2012-10-27
  Administered 2012-10-27 (×2): 1 g via INTRAVENOUS
  Filled 2012-10-27 (×2): qty 50

## 2012-10-27 MED ORDER — ALUM & MAG HYDROXIDE-SIMETH 200-200-20 MG/5ML PO SUSP
30.0000 mL | ORAL | Status: DC | PRN
  Filled 2012-10-27: qty 30

## 2012-10-27 MED ORDER — SODIUM CHLORIDE 0.9 % IR SOLN
Status: DC | PRN
  Administered 2012-10-27: 10:00:00

## 2012-10-27 MED ORDER — LIDOCAINE HCL (PF) 2 % IJ SOLN
INTRAMUSCULAR | Status: DC | PRN
  Administered 2012-10-27: 40 mg

## 2012-10-27 MED ORDER — FLUTICASONE PROPIONATE 50 MCG/ACT NA SUSP
1.0000 | Freq: Every day | NASAL | Status: DC | PRN
  Filled 2012-10-27: qty 16

## 2012-10-27 MED ORDER — PHENOL 1.4 % MT LIQD: 1.0000 | OROMUCOSAL | Status: DC | PRN

## 2012-10-27 MED ORDER — ROCURONIUM BROMIDE 100 MG/10ML IV SOLN
INTRAVENOUS | Status: DC | PRN
  Administered 2012-10-27: 30 mg via INTRAVENOUS

## 2012-10-27 MED ORDER — ACETAMINOPHEN 325 MG PO TABS
650.0000 mg | ORAL_TABLET | Freq: Four times a day (QID) | ORAL | Status: DC | PRN
  Administered 2012-10-29 – 2012-10-30 (×2): 650 mg via ORAL
  Filled 2012-10-27 (×2): qty 2

## 2012-10-27 MED ORDER — EPHEDRINE SULFATE 50 MG/ML IJ SOLN
INTRAMUSCULAR | Status: DC | PRN
  Administered 2012-10-27: 5 mg via INTRAVENOUS
  Administered 2012-10-27: 10 mg via INTRAVENOUS

## 2012-10-27 MED ORDER — HYDRALAZINE HCL 50 MG PO TABS
100.0000 mg | ORAL_TABLET | Freq: Every day | ORAL | Status: DC
  Administered 2012-10-27: 100 mg via ORAL
  Administered 2012-10-28: 50 mg via ORAL
  Administered 2012-10-29 – 2012-10-30 (×2): 100 mg via ORAL
  Filled 2012-10-27 (×5): qty 2

## 2012-10-27 MED ORDER — HYDRALAZINE HCL 50 MG PO TABS
50.0000 mg | ORAL_TABLET | Freq: Two times a day (BID) | ORAL | Status: DC
  Administered 2012-10-28 – 2012-10-31 (×7): 50 mg via ORAL
  Filled 2012-10-27 (×10): qty 1

## 2012-10-27 MED ORDER — METHOCARBAMOL 500 MG PO TABS
500.0000 mg | ORAL_TABLET | Freq: Four times a day (QID) | ORAL | Status: DC | PRN
  Administered 2012-10-27 – 2012-10-31 (×7): 500 mg via ORAL
  Filled 2012-10-27 (×7): qty 1

## 2012-10-27 MED ORDER — SODIUM CHLORIDE 0.9 % IR SOLN
Status: DC | PRN
  Administered 2012-10-27: 1000 mL

## 2012-10-27 MED ORDER — PANTOPRAZOLE SODIUM 20 MG PO TBEC
20.0000 mg | DELAYED_RELEASE_TABLET | Freq: Every day | ORAL | Status: DC
  Administered 2012-10-27 – 2012-10-31 (×5): 20 mg via ORAL
  Filled 2012-10-27 (×6): qty 1

## 2012-10-27 MED ORDER — SUCCINYLCHOLINE CHLORIDE 20 MG/ML IJ SOLN
INTRAMUSCULAR | Status: DC | PRN
  Administered 2012-10-27: 100 mg via INTRAVENOUS

## 2012-10-27 MED ORDER — POLYETHYLENE GLYCOL 3350 17 G PO PACK
17.0000 g | PACK | Freq: Every day | ORAL | Status: DC | PRN
  Administered 2012-10-28 – 2012-10-31 (×3): 17 g via ORAL
  Filled 2012-10-27: qty 1

## 2012-10-27 MED ORDER — NEOSTIGMINE METHYLSULFATE 1 MG/ML IJ SOLN
INTRAMUSCULAR | Status: DC | PRN
  Administered 2012-10-27: 3 mg via INTRAVENOUS

## 2012-10-27 MED ORDER — FENTANYL CITRATE 0.05 MG/ML IJ SOLN
INTRAMUSCULAR | Status: DC | PRN
  Administered 2012-10-27: 100 ug via INTRAVENOUS

## 2012-10-27 MED ORDER — BUPIVACAINE LIPOSOME 1.3 % IJ SUSP
20.0000 mL | Freq: Once | INTRAMUSCULAR | Status: DC
  Filled 2012-10-27: qty 20

## 2012-10-27 MED ORDER — MEPERIDINE HCL 50 MG/ML IJ SOLN: 6.2500 mg | INTRAMUSCULAR | Status: DC | PRN

## 2012-10-27 MED ORDER — LOSARTAN POTASSIUM 50 MG PO TABS
100.0000 mg | ORAL_TABLET | Freq: Every morning | ORAL | Status: DC
  Administered 2012-10-28 – 2012-10-31 (×3): 100 mg via ORAL
  Filled 2012-10-27 (×5): qty 2

## 2012-10-27 MED ORDER — MIDAZOLAM HCL 5 MG/5ML IJ SOLN
INTRAMUSCULAR | Status: DC | PRN
  Administered 2012-10-27 (×2): 1 mg via INTRAVENOUS

## 2012-10-27 SURGICAL SUPPLY — 75 items
BAG ZIPLOCK 12X15 (MISCELLANEOUS) ×2 IMPLANT
BANDAGE ELASTIC 4 VELCRO ST LF (GAUZE/BANDAGES/DRESSINGS) ×2 IMPLANT
BANDAGE ELASTIC 6 VELCRO ST LF (GAUZE/BANDAGES/DRESSINGS) ×2 IMPLANT
BANDAGE ESMARK 6X9 LF (GAUZE/BANDAGES/DRESSINGS) ×1 IMPLANT
BLADE SAG 18X100X1.27 (BLADE) ×2 IMPLANT
BLADE SAW SGTL 11.0X1.19X90.0M (BLADE) ×2 IMPLANT
BNDG COHESIVE 4X5 TAN NS LF (GAUZE/BANDAGES/DRESSINGS) IMPLANT
BNDG ESMARK 6X9 LF (GAUZE/BANDAGES/DRESSINGS) ×2
BONE CEMENT GENTAMICIN (Cement) ×4 IMPLANT
CAPT RP KNEE ×2 IMPLANT
CEMENT BONE GENTAMICIN 40 (Cement) ×2 IMPLANT
CLOTH BEACON ORANGE TIMEOUT ST (SAFETY) ×2 IMPLANT
CUFF TOURN SGL QUICK 34 (TOURNIQUET CUFF) ×1
CUFF TRNQT CYL 34X4X40X1 (TOURNIQUET CUFF) ×1 IMPLANT
DERMABOND ADVANCED (GAUZE/BANDAGES/DRESSINGS)
DERMABOND ADVANCED .7 DNX12 (GAUZE/BANDAGES/DRESSINGS) IMPLANT
DRAPE EXTREMITY T 121X128X90 (DRAPE) ×2 IMPLANT
DRAPE INCISE IOBAN 66X45 STRL (DRAPES) ×2 IMPLANT
DRAPE LG THREE QUARTER DISP (DRAPES) ×2 IMPLANT
DRAPE POUCH INSTRU U-SHP 10X18 (DRAPES) ×2 IMPLANT
DRAPE U-SHAPE 47X51 STRL (DRAPES) ×2 IMPLANT
DRSG ADAPTIC 3X8 NADH LF (GAUZE/BANDAGES/DRESSINGS) ×2 IMPLANT
DRSG AQUACEL AG ADV 3.5X10 (GAUZE/BANDAGES/DRESSINGS) IMPLANT
DRSG PAD ABDOMINAL 8X10 ST (GAUZE/BANDAGES/DRESSINGS) ×2 IMPLANT
DRSG TEGADERM 4X4.75 (GAUZE/BANDAGES/DRESSINGS) ×2 IMPLANT
DURAPREP 26ML APPLICATOR (WOUND CARE) ×2 IMPLANT
ELECT REM PT RETURN 9FT ADLT (ELECTROSURGICAL) ×2
ELECTRODE REM PT RTRN 9FT ADLT (ELECTROSURGICAL) ×1 IMPLANT
EVACUATOR 1/8 PVC DRAIN (DRAIN) ×2 IMPLANT
GAUZE SPONGE 2X2 8PLY STRL LF (GAUZE/BANDAGES/DRESSINGS) ×1 IMPLANT
GLOVE BIOGEL PI IND STRL 7.0 (GLOVE) ×1 IMPLANT
GLOVE BIOGEL PI IND STRL 7.5 (GLOVE) ×1 IMPLANT
GLOVE BIOGEL PI IND STRL 8 (GLOVE) ×2 IMPLANT
GLOVE BIOGEL PI INDICATOR 7.0 (GLOVE) ×1
GLOVE BIOGEL PI INDICATOR 7.5 (GLOVE) ×1
GLOVE BIOGEL PI INDICATOR 8 (GLOVE) ×2
GLOVE ECLIPSE 8.0 STRL XLNG CF (GLOVE) ×8 IMPLANT
GLOVE SURG SS PI 6.5 STRL IVOR (GLOVE) ×2 IMPLANT
GLOVE SURG SS PI 7.0 STRL IVOR (GLOVE) ×2 IMPLANT
GOWN PREVENTION PLUS LG XLONG (DISPOSABLE) IMPLANT
GOWN STRL NON-REIN LRG LVL3 (GOWN DISPOSABLE) ×2 IMPLANT
GOWN STRL REIN XL XLG (GOWN DISPOSABLE) ×6 IMPLANT
HANDPIECE INTERPULSE COAX TIP (DISPOSABLE) ×1
IMMOBILIZER KNEE 20 (SOFTGOODS) ×2
IMMOBILIZER KNEE 20 THIGH 36 (SOFTGOODS) ×1 IMPLANT
KIT BASIN OR (CUSTOM PROCEDURE TRAY) ×2 IMPLANT
MANIFOLD NEPTUNE II (INSTRUMENTS) ×2 IMPLANT
NEEDLE HYPO 22GX1.5 SAFETY (NEEDLE) ×2 IMPLANT
NS IRRIG 1000ML POUR BTL (IV SOLUTION) IMPLANT
PACK TOTAL JOINT (CUSTOM PROCEDURE TRAY) ×2 IMPLANT
PADDING CAST COTTON 6X4 STRL (CAST SUPPLIES) ×2 IMPLANT
POSITIONER SURGICAL ARM (MISCELLANEOUS) ×2 IMPLANT
SET HNDPC FAN SPRY TIP SCT (DISPOSABLE) ×1 IMPLANT
SPONGE GAUZE 2X2 STER 10/PKG (GAUZE/BANDAGES/DRESSINGS) ×1
SPONGE GAUZE 4X4 12PLY (GAUZE/BANDAGES/DRESSINGS) ×2 IMPLANT
SPONGE LAP 18X18 X RAY DECT (DISPOSABLE) IMPLANT
SPONGE SURGIFOAM ABS GEL 100 (HEMOSTASIS) ×2 IMPLANT
STAPLER VISISTAT 35W (STAPLE) ×2 IMPLANT
SUT BONE WAX W31G (SUTURE) ×2 IMPLANT
SUT MNCRL AB 4-0 PS2 18 (SUTURE) ×2 IMPLANT
SUT VIC AB 0 CT1 27 (SUTURE) ×1
SUT VIC AB 0 CT1 27XBRD ANTBC (SUTURE) ×1 IMPLANT
SUT VIC AB 1 CT1 27 (SUTURE) ×4
SUT VIC AB 1 CT1 27XBRD ANTBC (SUTURE) ×4 IMPLANT
SUT VIC AB 1 CT1 36 (SUTURE) IMPLANT
SUT VIC AB 2-0 CT1 27 (SUTURE) ×2
SUT VIC AB 2-0 CT1 TAPERPNT 27 (SUTURE) ×2 IMPLANT
SUT VLOC 180 0 24IN GS25 (SUTURE) ×2 IMPLANT
SYR 20CC LL (SYRINGE) ×2 IMPLANT
TOWEL OR 17X26 10 PK STRL BLUE (TOWEL DISPOSABLE) ×4 IMPLANT
TOWER CARTRIDGE SMART MIX (DISPOSABLE) ×2 IMPLANT
TRAY FOLEY CATH 14FRSI W/METER (CATHETERS) ×2 IMPLANT
WATER STERILE IRR 1500ML POUR (IV SOLUTION) ×4 IMPLANT
WRAP KNEE MAXI GEL POST OP (GAUZE/BANDAGES/DRESSINGS) ×4 IMPLANT
YANKAUER SUCT BULB TIP NO VENT (SUCTIONS) ×2 IMPLANT

## 2012-10-27 NOTE — Anesthesia Preprocedure Evaluation (Addendum)
Anesthesia Evaluation  Patient identified by MRN, date of birth, ID band Patient awake    Reviewed: Allergy & Precautions, H&P , NPO status , Patient's Chart, lab work & pertinent test results  Airway Mallampati: II TM Distance: >3 FB Neck ROM: Full    Dental no notable dental hx.    Pulmonary neg pulmonary ROS,  breath sounds clear to auscultation  Pulmonary exam normal       Cardiovascular hypertension, Pt. on medications Rhythm:Regular Rate:Normal     Neuro/Psych negative neurological ROS  negative psych ROS   GI/Hepatic negative GI ROS, Neg liver ROS,   Endo/Other  negative endocrine ROS  Renal/GU negative Renal ROS  negative genitourinary   Musculoskeletal negative musculoskeletal ROS (+)   Abdominal   Peds negative pediatric ROS (+)  Hematology negative hematology ROS (+)   Anesthesia Other Findings Multiple crowns   Reproductive/Obstetrics negative OB ROS                           Anesthesia Physical Anesthesia Plan  ASA: II  Anesthesia Plan: General   Post-op Pain Management:    Induction: Intravenous  Airway Management Planned: Oral ETT  Additional Equipment:   Intra-op Plan:   Post-operative Plan: Extubation in OR  Informed Consent: I have reviewed the patients History and Physical, chart, labs and discussed the procedure including the risks, benefits and alternatives for the proposed anesthesia with the patient or authorized representative who has indicated his/her understanding and acceptance.   Dental advisory given  Plan Discussed with: CRNA  Anesthesia Plan Comments:        Anesthesia Quick Evaluation

## 2012-10-27 NOTE — Progress Notes (Signed)
PT Cancellation Note  Patient Details Name: Megan Page MRN: [id number] DOB: [birth year]   Cancelled Treatment:     POD 0 eval refused by pt 2* "my leg hurts".  Will follow in am.   [name] 10/27/2012, 3:45 PM

## 2012-10-27 NOTE — Preoperative (Signed)
Beta Blockers   Reason not to administer Beta Blockers:Not Applicable, Took beta blocker this AM

## 2012-10-27 NOTE — Interval H&P Note (Signed)
History and Physical Interval Note:  10/27/2012 9:27 AM  Megan Page  has presented today for surgery, with the diagnosis of OA LEFT KNEE  The various methods of treatment have been discussed with the patient and family. After consideration of risks, benefits and other options for treatment, the patient has consented to  Procedure(s): TOTAL KNEE ARTHROPLASTY (Left) as a surgical intervention .  The patient's history has been reviewed, patient examined, no change in status, stable for surgery.  I have reviewed the patient's chart and labs.  Questions were answered to the patient's satisfaction.     [name] A

## 2012-10-27 NOTE — Transfer of Care (Signed)
Immediate Anesthesia Transfer of Care Note  Patient: Megan Page  Procedure(s) Performed: Procedure(s) (LRB): TOTAL KNEE ARTHROPLASTY (Left)  Patient Location: PACU  Anesthesia Type: General  Level of Consciousness: sedated, patient cooperative and responds to stimulaton  Airway & Oxygen Therapy: Patient Spontanous Breathing and Patient connected to face mask oxgen  Post-op Assessment: Report given to PACU RN and Post -op Vital signs reviewed and stable  Post vital signs: Reviewed and stable  Complications: No apparent anesthesia complications

## 2012-10-27 NOTE — Progress Notes (Signed)
Portable AP and Lateral Left Knee X-rays done. 

## 2012-10-27 NOTE — Progress Notes (Signed)
X-ray results noted 

## 2012-10-27 NOTE — Op Note (Signed)
NAMEPIERRETTE, SCHEU NO.:  [id number]  MEDICAL RECORD NO.:  [id number]  LOCATION:  [facility]                         FACILITY:  [facility]  PHYSICIAN:  Georges Lynch. Latondra Gebhart, M.D.DATE OF BIRTH:  [birth year]  DATE OF PROCEDURE:  10/27/2012 DATE OF DISCHARGE:                              OPERATIVE REPORT   SURGEON:  Georges Lynch. Darrelyn Hillock, M.D.  ASSISTANT:  Marlowe Kays, MD.  PREOPERATIVE DIAGNOSIS:  Osteoarthritis with bone-on-bone with a flexion contracture, left knee.  POSTOPERATIVE DIAGNOSIS:  Osteoarthritis with bone-on-bone with a flexion contracture, left knee.  OPERATION:  Left total knee arthroplasty.  The DePuy system was used. All 3 components were cemented and gentamicin was used in the cement. The sizes used was a size 3 left posterior cruciate sacrificing femoral component, tibial tray was a size 3.  The insert was a size 3 and 12.5 mm thickness.  The patella was a size 38.  Note all 3 components were cemented.  PROCEDURE IN DETAIL:  Under general anesthesia, routine orthopedic prep and draping of the left lower extremity was carried out.  The appropriate time-out was first carried out.  I also marked the appropriate left leg in the holding area.  At this time, the leg was exsanguinated with an Esmarch.  Tourniquet was elevated to 325 mmHg. The knee was flexed and incision was made over the anterior aspect of the left knee.  Bleeders were identified and cauterized.  Two flaps were created.  I then carried out a median parapatellar incision and reflected the patella laterally.  We did anterior-posterior cruciate resection and excised the medial and lateral menisci.  Following that, my initial drill hole was made in the intercondylar notch.  I then inserted the canal finder.  I then irrigated out the femoral canal, 12 mm thickness was taken off the distal femur with the first jig. Following that, we measured the femur to be a size 3, and we did a appropriate  anterior, posterior, and chamfering cuts for a size 3 left femur.  After that, we measured the tray on the tibial side to be a size 3.  We removed 6 mm thickness off the affected medial side, which was actually more depressed from the lateral side.  After that, I inserted a laminar spreaders and checked for posterior osteophytes.  We then inserted our spacer blocks and I elected to use a 12.5 mm thickness tibial spacer.  After that we irrigated out the knee and then made my appropriate keel cut in the tibia.  I then cut my notch cut out of the distal femur.  Trial components were inserted.  We first went with a 10 mm thickness and felt that the 12.5 mm thickness insert was the best. So, once the knee was held in extension, I then did a resurfacing procedure on the patella in the usual fashion.  Three drill holes were made in the patella for a 38 mm patella.  All trial components were removed.  I thoroughly water picked out the knee, and then inserted all 3 components after the knee was dried out.  We cemented all 3 components with gentamicin and cement.  Once the cement was hardened, we thoroughly removed  all loose pieces of cement and water picked out the knee, and I then injected a portion of our mixture of 20 mL of Exparel with 20 mL of normal saline.  We then inserted our permanent tibial tray as a rotating platform, size 3 and 12.5 mm thickness.  The knee was reduced.  We had excellent function and excellent medial and lateral stability.  I then inserted a Hemovac drain and closed the wound layers in usual fashion. Skin was closed with metal staples.  Sterile Neosporin dressing was applied.  Prior to closing the wound, I injected the remaining portion of the mixture of saline and Exparel.  The patient left the operating room in satisfactory condition with a sterile dressing and Hemovac in place.  She had 2 g of IV Ancef preop.          ______________________________ Georges Lynch.  Darrelyn Hillock, M.D.     RAG/MEDQ  D:  10/27/2012  T:  10/27/2012  Job:  [id number]

## 2012-10-27 NOTE — Care Management Note (Signed)
    Page 1 of 1   10/27/2012     4:59:13 PM   CARE MANAGEMENT NOTE 10/27/2012  Patient:  Megan Page, Megan Page   Account Number:  [id number]  Date Initiated:  10/27/2012  Documentation initiated by:  Colleen Can  Subjective/Objective Assessment:   dx total knee replacemnt     Action/Plan:   SNF rehab-wants [facility] in [place]   Anticipated DC Date:  10/30/2012   Anticipated DC Plan:  SKILLED NURSING FACILITY      DC Planning Services  CM consult      Choice offered to / List presented to:             Status of service:  Completed, signed off Medicare Important Message given?  NA - LOS <3 / Initial given by admissions (If response is "NO", the following Medicare IM given date fields will be blank) Date Medicare IM given:   Date Additional Medicare IM given:    Discharge Disposition:    Per UR Regulation:    If discussed at Long Length of Stay Meetings, dates discussed:    Comments:

## 2012-10-27 NOTE — Brief Op Note (Signed)
10/27/2012  11:45 AM  PATIENT:  Megan Page  79 y.o. female  PRE-OPERATIVE DIAGNOSIS:  OA LEFT KNEE  POST-OPERATIVE DIAGNOSIS:  OA LEFT KNEE  PROCEDURE:  Procedure(s): TOTAL KNEE ARTHROPLASTY (Left)  SURGEON:  Surgeon(s) and Role:    * Jacki Cones, MD - Primary    * Drucilla Schmidt, MD - Assisting   ASSISTANTS:James Aplington MD   ANESTHESIA:   general  EBL:  Total I/O In: 1000 [I.V.:1000] Out: 250 [Urine:200; Blood:50]  BLOOD ADMINISTERED:none  DRAINS: (One) Hemovact drain(s) in the Left Knee with  Suction Open   LOCAL MEDICATIONS USED:  BUPIVICAINE 20cc mixed with 20cc Normal Saline   SPECIMEN:  No Specimen  DISPOSITION OF SPECIMEN:  No Specimen  COUNTS:  YES  TOURNIQUET:   Total Tourniquet Time Documented: Thigh (Left) - 96 minutes Total: Thigh (Left) - 96 minutes   DICTATION: .Other Dictation: Dictation Number [id number]  PLAN OF CARE: Admit to inpatient   PATIENT DISPOSITION:  PACU - hemodynamically stable.   Delay start of Pharmacological VTE agent (>24hrs) due to surgical blood loss or risk of bleeding: yes

## 2012-10-27 NOTE — Anesthesia Postprocedure Evaluation (Signed)
  Anesthesia Post-op Note  Patient: Megan Page  Procedure(s) Performed: Procedure(s) (LRB): TOTAL KNEE ARTHROPLASTY (Left)  Patient Location: PACU  Anesthesia Type: General  Level of Consciousness: awake and alert   Airway and Oxygen Therapy: Patient Spontanous Breathing  Post-op Pain: mild  Post-op Assessment: Post-op Vital signs reviewed, Patient's Cardiovascular Status Stable, Respiratory Function Stable, Patent Airway and No signs of Nausea or vomiting  Last Vitals:  Filed Vitals:   10/27/12 1600  BP: 142/87  Pulse: 77  Temp: 36.6 C  Resp: 15    Post-op Vital Signs: stable   Complications: No apparent anesthesia complications

## 2012-10-28 LAB — CBC
HCT: 28.3 % — ABNORMAL LOW (ref 36.0–46.0)
Hemoglobin: 9.2 g/dL — ABNORMAL LOW (ref 12.0–15.0)
WBC: 13.1 10*3/uL — ABNORMAL HIGH (ref 4.0–10.5)

## 2012-10-28 LAB — BASIC METABOLIC PANEL
BUN: 10 mg/dL (ref 6–23)
Chloride: 104 mEq/L (ref 96–112)
Glucose, Bld: 141 mg/dL — ABNORMAL HIGH (ref 70–99)
Potassium: 3.6 mEq/L (ref 3.5–5.1)

## 2012-10-28 MED ORDER — HYDROCODONE-ACETAMINOPHEN 5-325 MG PO TABS: 1.0000 | ORAL_TABLET | ORAL | Status: DC | PRN

## 2012-10-28 MED ORDER — ZOLPIDEM TARTRATE 5 MG PO TABS
5.0000 mg | ORAL_TABLET | Freq: Every evening | ORAL | Status: DC | PRN
  Administered 2012-10-28 – 2012-10-30 (×3): 5 mg via ORAL
  Filled 2012-10-28 (×3): qty 1

## 2012-10-28 MED ORDER — RIVAROXABAN 10 MG PO TABS: 10.0000 mg | ORAL_TABLET | Freq: Every day | ORAL | Status: DC

## 2012-10-28 MED ORDER — ZOLPIDEM TARTRATE 10 MG PO TABS: 10.0000 mg | ORAL_TABLET | Freq: Every evening | ORAL | Status: DC | PRN

## 2012-10-28 MED ORDER — METHOCARBAMOL 500 MG PO TABS
500.0000 mg | ORAL_TABLET | Freq: Four times a day (QID) | ORAL | Status: DC | PRN
Start: 2012-10-28 — End: 2013-08-31

## 2012-10-28 NOTE — Progress Notes (Signed)
Physical Therapy Treatment Patient Details Name: Megan Page MRN: [id number] DOB: [birth year] Today's Date: 10/28/2012 Time: 1030-1040 PT Time Calculation (min): 10 min  PT Assessment / Plan / Recommendation  History of Present Illness Megan Page, 79 y.o. female, has a history of pain and functional disability in the left knee due to arthritis and has failed non-surgical conservative treatments for greater than 12 weeks to includeNSAID's and/or analgesics, corticosteriod injections, viscosupplementation injections, use of assistive devices and activity modification.  Onset of symptoms was gradual, starting 6 years ago with gradually worsening course since that time. The patient noted prior procedures on the knee to include  arthroscopy and menisectomy on the left knee(s).   PT Comments   Pt reports she thinks she will feels better after she gets some sleep  Follow Up Recommendations  SNF     Does the patient have the potential to tolerate intense rehabilitation     Barriers to Discharge        Equipment Recommendations  None recommended by PT    Recommendations for Other Services    Frequency 7X/week   Progress towards PT Goals Progress towards PT goals: Progressing toward goals  Plan Current plan remains appropriate    Precautions / Restrictions Precautions Precautions: Knee Required Braces or Orthoses: Knee Immobilizer - Left Knee Immobilizer - Left: On when out of bed or walking Restrictions Weight Bearing Restrictions: No (WBAT)   Pertinent Vitals/Pain Pain in left leg    Mobility  Bed Mobility Bed Mobility: Sit to Supine Supine to Sit: 1: +2 Total assist Supine to Sit: Patient Percentage: 50% Sit to Supine: 1: +2 Total assist Sit to Supine: Patient Percentage: 50% Details for Bed Mobility Assistance: pt able to use right leg to bridge to edge of bed and assit with pulling up to sitting Transfers Transfers: Sit to Stand;Stand to Sit Sit to Stand: 1: +2  Total assist Stand to Sit: 1: +2 Total assist Stand to Sit: Patient Percentage: 50% Details for Transfer Assistance: pt limited by pain in LLE, moves very slowly, tends to keep eyes closed Ambulation/Gait Ambulation/Gait Assistance: 1: +2 Total assist Ambulation/Gait: Patient Percentage: 50% Ambulation Distance (Feet): 8 Feet Assistive device: Rolling walker Ambulation/Gait Assistance Details: cues for sequence Gait Pattern: Step-to pattern;Decreased step length - right;Decreased step length - left Gait velocity: slow General Gait Details: better able to weight shift, advance left leg  and bear weight on it. Stairs: No Wheelchair Mobility Wheelchair Mobility: No    Exercises Total Joint Exercises Ankle Circles/Pumps: AROM;Both;10 reps;Supine Quad Sets: AROM;Left;10 reps;Supine Short Arc Quad: AAROM;Left;5 reps;Supine Hip ABduction/ADduction: AAROM;Left;5 reps;Supine Straight Leg Raises: AAROM;Left;5 reps;Supine Knee Flexion: AAROM;Left;5 reps Goniometric ROM: ~60 degrees   PT Diagnosis: Difficulty walking  PT Problem List: Decreased strength;Decreased range of motion;Decreased activity tolerance;Decreased mobility;Pain PT Treatment Interventions: DME instruction;Gait training;Functional mobility training;Therapeutic activities;Therapeutic exercise;Balance training;Patient/family education   PT Goals (current goals can now be found in the care plan section) Acute Rehab PT Goals Patient Stated Goal: to go to [facility] for rehab PT Goal Formulation: With patient Time For Goal Achievement: 11/11/12 Potential to Achieve Goals: Good  Visit Information  Last PT Received On: 10/28/12 Assistance Needed: +2 History of Present Illness: Megan Page, 79 y.o. female, has a history of pain and functional disability in the left knee due to arthritis and has failed non-surgical conservative treatments for greater than 12 weeks to includeNSAID's and/or analgesics, corticosteriod injections,  viscosupplementation injections, use of assistive devices and activity modification.  Onset of symptoms was  gradual, starting 6 years ago with gradually worsening course since that time. The patient noted prior procedures on the knee to include  arthroscopy and menisectomy on the left knee(s).    Subjective Data  Patient Stated Goal: to go to [facility] for rehab   Cognition  Cognition Arousal/Alertness: Awake/alert Behavior During Therapy: WFL for tasks assessed/performed Overall Cognitive Status: Within Functional Limits for tasks assessed    Balance  Balance Balance Assessed: Yes Static Sitting Balance Static Sitting - Balance Support: Bilateral upper extremity supported;Feet supported Static Sitting - Level of Assistance: 5: Stand by assistance Static Standing Balance Static Standing - Balance Support: Bilateral upper extremity supported;During functional activity Static Standing - Level of Assistance: 5: Stand by assistance  End of Session PT - End of Session Equipment Utilized During Treatment: Gait belt Activity Tolerance: Patient limited by pain;Patient limited by fatigue Patient left: in bed;with family/visitor present Nurse Communication: Mobility status   GP    Bayard Hugger. Manson Passey, [place] [phone] 10/28/2012, 10:58 AM

## 2012-10-28 NOTE — Evaluation (Signed)
Occupational Therapy Evaluation Patient Details Name: Megan Page MRN: [id number] DOB: [birth year] Today's Date: 10/28/2012 Time: 1450-1510 OT Time Calculation (min): 20 min  OT Assessment / Plan / Recommendation History of present illness Megan Page, 79 y.o. female, has a history of pain and functional disability in the left knee due to arthritis and has failed non-surgical conservative treatments for greater than 12 weeks to includeNSAID's and/or analgesics, corticosteriod injections, viscosupplementation injections, use of assistive devices and activity modification.  Onset of symptoms was gradual, starting 6 years ago with gradually worsening course since that time. The patient noted prior procedures on the knee to include  arthroscopy and menisectomy on the left knee(s).   Clinical Impression   Pt requires +2 assist for mobility OOB, this being POD 1.  Began education in ADL and AE use.  Pt lives alone and will need post acute rehab in a SNF.  Prefers a SNF near her home in [place].  Will defer OT to SNF.    OT Assessment  All further OT needs can be met in the next venue of care    Follow Up Recommendations  SNF    Barriers to Discharge      Equipment Recommendations  None recommended by OT    Recommendations for Other Services    Frequency       Precautions / Restrictions Precautions Precautions: Knee;Fall Required Braces or Orthoses: Knee Immobilizer - Left Knee Immobilizer - Left: On when out of bed or walking Restrictions Weight Bearing Restrictions: No   Pertinent Vitals/Pain L knee 6/10, iced, repositioned, RN notified    ADL  Eating/Feeding: Independent Where Assessed - Eating/Feeding: Bed level Grooming: Wash/dry hands;Set up Where Assessed - Grooming: Unsupported sitting Upper Body Bathing: Set up Where Assessed - Upper Body Bathing: Unsupported sitting Lower Body Bathing: Maximal assistance Where Assessed - Lower Body Bathing: Unsupported  sitting;Supported sit to stand Upper Body Dressing: Set up Where Assessed - Upper Body Dressing: Unsupported sitting Lower Body Dressing: Maximal assistance Where Assessed - Lower Body Dressing: Supported sitting;Unsupported standing Toilet Transfer: +2 Total assistance Toilet Transfer: Patient Percentage: 50% Toilet Transfer Method: Stand pivot Toilet Transfer Equipment: Bedside commode Toileting - Clothing Manipulation and Hygiene: Supervision/safety Where Assessed - Toileting Clothing Manipulation and Hygiene: Sit on 3-in-1 or toilet Equipment Used: Gait belt;Knee Immobilizer;Long-handled shoe horn;Long-handled sponge;Reacher;Rolling walker;Sock aid ADL Comments: Educated pt and family in AE for LB ADL.    OT Diagnosis: Generalized weakness;Acute pain  OT Problem List: Decreased strength;Decreased activity tolerance;Impaired balance (sitting and/or standing);Decreased knowledge of use of DME or AE;Pain OT Treatment Interventions:     OT Goals(Current goals can be found in the care plan section) Acute Rehab OT Goals Patient Stated Goal: to go to [facility] for rehab  Visit Information  Last OT Received On: 10/28/12 Assistance Needed: +2 History of Present Illness: Megan Page, 79 y.o. female, has a history of pain and functional disability in the left knee due to arthritis and has failed non-surgical conservative treatments for greater than 12 weeks to includeNSAID's and/or analgesics, corticosteriod injections, viscosupplementation injections, use of assistive devices and activity modification.  Onset of symptoms was gradual, starting 6 years ago with gradually worsening course since that time. The patient noted prior procedures on the knee to include  arthroscopy and menisectomy on the left knee(s).       Prior Functioning     Home Living Family/patient expects to be discharged to:: Skilled nursing facility Living Arrangements: Alone Additional Comments: pt lives alone in  one  story home with 3 steps with 2 handrails to enter.  Pt has cane, rollator, RW at home Prior Function Level of Independence: Independent with assistive device(s) Communication Communication: No difficulties Dominant Hand: Right         Vision/Perception Vision - History Patient Visual Report: No change from baseline   Cognition  Cognition Arousal/Alertness: Awake/alert Behavior During Therapy: WFL for tasks assessed/performed Overall Cognitive Status: Within Functional Limits for tasks assessed    Extremity/Trunk Assessment Upper Extremity Assessment Upper Extremity Assessment: Overall WFL for tasks assessed Lower Extremity Assessment Lower Extremity Assessment: Defer to PT evaluation RLE Sensation: history of peripheral neuropathy LLE Sensation: history of peripheral neuropathy Cervical / Trunk Assessment Cervical / Trunk Assessment: Normal     Mobility Bed Mobility Bed Mobility: Sit to Supine Sit to Supine: 1: +2 Total assist Sit to Supine: Patient Percentage: 50% Transfers Transfers: Sit to Stand;Stand to Sit Sit to Stand: 1: +2 Total assist Sit to Stand: Patient Percentage: 50% Stand to Sit: 1: +2 Total assist Stand to Sit: Patient Percentage: 50% Details for Transfer Assistance: pt limited by pain in LLE, moves very slowly, tends to keep eyes closed     Exercise     Balance Static Sitting Balance Static Sitting - Balance Support: Bilateral upper extremity supported;Feet supported Static Sitting - Level of Assistance: 5: Stand by assistance   End of Session OT - End of Session Activity Tolerance: Patient limited by pain Patient left: in bed;with call bell/phone within reach;with family/visitor present Nurse Communication: Patient requests pain meds  GO     Evern Bio 10/28/2012, 3:14 PM [phone]

## 2012-10-28 NOTE — Progress Notes (Addendum)
Clinical Social Work Department CLINICAL SOCIAL WORK PLACEMENT NOTE 10/28/2012  Patient:  Megan Page, Megan Page  Account Number:  [id number] Admit date:  10/27/2012  Clinical Social Worker:  Doroteo Glassman  Date/time:  10/28/2012 10:49 AM  Clinical Social Work is seeking post-discharge placement for this patient at the following level of care:   SKILLED NURSING   (*CSW will update this form in Epic as items are completed)   10/28/2012  Patient/family provided with [facility] Department of Clinical Social Work's list of facilities offering this level of care within the geographic area requested by the patient (or if unable, by the patient's family).  10/28/2012  Patient/family informed of their freedom to choose among providers that offer the needed level of care, that participate in Medicare, Medicaid or managed care program needed by the patient, have an available bed and are willing to accept the patient.  10/28/2012  Patient/family informed of [facility]' ownership interest in [facility], as well as of the fact that they are under no obligation to receive care at this facility.  PASARR submitted to EDS on 10/28/2012 PASARR number received from EDS on 10/28/2012  FL2 transmitted to all facilities in geographic area requested by pt/family on  10/28/2012 FL2 transmitted to all facilities within larger geographic area on   Patient informed that his/her managed care company has contracts with or will negotiate with  certain facilities, including the following:     Patient/family informed of bed offers received:  10/30/2012 Patient chooses bed at [facility] Physician recommends and patient chooses bed at    Patient to be transferred to  on  [facility] Patient to be transferred to facility by   The following physician request were entered in Epic:   Additional Comments:  Providence Crosby, LCSWA Clinical Social Work [phone]   Jacklynn Lewis, MSW, [facility]   Clinical Social Work Coverage for [employer] [phone]

## 2012-10-28 NOTE — Progress Notes (Signed)
Clinical Social Work Department BRIEF PSYCHOSOCIAL ASSESSMENT 10/28/2012  Patient:  Megan Page, Megan Page     Account Number:  [id number]     Admit date:  10/27/2012  Clinical Social Worker:  Doroteo Glassman  Date/Time:  10/28/2012 10:45 AM  Referred by:  Physician  Date Referred:  10/28/2012 Referred for  SNF Placement   Other Referral:   Interview type:  Patient Other interview type:   Pt's daughter at bedside    PSYCHOSOCIAL DATA Living Status:  ALONE Admitted from facility:   Level of care:   Primary support name:  Dondra Spry Primary support relationship to patient:  CHILD, ADULT Degree of support available:   strong    CURRENT CONCERNS Current Concerns  Post-Acute Placement   Other Concerns:    SOCIAL WORK ASSESSMENT / PLAN Met with Pt and daughter to discuss d/c plans.    Pt reported that she lives in [place] and that she'd like to go to [facility], as the facility is 10 minutes away from her home.  CSW discussed the SNF process and the possibility that [facility] won't have a bed.  Pt and daughter voiced an understanding and agreed to a search of all of [facility].    CSW thanked Pt and her daughter for their time.   Assessment/plan status:  Psychosocial Support/Ongoing Assessment of Needs Other assessment/ plan:   Information/referral to community resources:   SNF list    PATIENT'S/FAMILY'S RESPONSE TO PLAN OF CARE: Pt understands that she needs rehab, as she lives alone and voiced that she's not able to manage on her own.  Pt is hopeful for [facility].    Pt and daughter thanked CSW for time and assistance.   Providence Crosby, LCSWA Clinical Social Work [phone]

## 2012-10-28 NOTE — Evaluation (Signed)
Physical Therapy Evaluation Patient Details Name: Elyzabeth Goatley MRN: [id number] DOB: 01-19-1929 Today's Date: 10/28/2012 Time: 1308-6578 PT Time Calculation (min): 25 min  PT Assessment / Plan / Recommendation History of Present Illness  [name], 79 y.o. female, has a history of pain and functional disability in the left knee due to arthritis and has failed non-surgical conservative treatments for greater than 12 weeks to includeNSAID's and/or analgesics, corticosteriod injections, viscosupplementation injections, use of assistive devices and activity modification.  Onset of symptoms was gradual, starting 6 years ago with gradually worsening course since that time. The patient noted prior procedures on the knee to include  arthroscopy and menisectomy on the left knee(s).  Clinical Impression  Pt limited by pain in initial session, but was willing to participate. Anticipate she will progress according to path as symptoms subside and agree with her plan for continued PT at SNF prior to d/c to home alone.    PT Assessment  Patient needs continued PT services    Follow Up Recommendations  SNF    Does the patient have the potential to tolerate intense rehabilitation      Barriers to Discharge        Equipment Recommendations  None recommended by PT    Recommendations for Other Services     Frequency 7X/week    Precautions / Restrictions Precautions Precautions: Knee Required Braces or Orthoses: Knee Immobilizer - Left Knee Immobilizer - Left: On when out of bed or walking Restrictions Weight Bearing Restrictions: No (WBAT)   Pertinent Vitals/Pain Pain in knee.  RN in to provide pain meds      Mobility  Bed Mobility Bed Mobility: Supine to Sit;Sit to Supine Supine to Sit: 1: +2 Total assist Supine to Sit: Patient Percentage: 50% Details for Bed Mobility Assistance: pt able to use right leg to bridge to edge of bed and assit with pulling up to  sitting Transfers Transfers: Sit to Stand;Stand to Sit Sit to Stand: 1: +2 Total assist Stand to Sit: 1: +2 Total assist Stand to Sit: Patient Percentage: 50% Details for Transfer Assistance: pt limited by pain in LLE, moves very slowly, tends to keep eyes closed Ambulation/Gait Ambulation/Gait Assistance: 1: +2 Total assist Ambulation/Gait: Patient Percentage: 50% Ambulation Distance (Feet): 3 Feet Assistive device: Rolling walker Ambulation/Gait Assistance Details: pt limted by pain, moves slowy, keeps eyes closed and c/o pain with slowed responses.  Pt did not c/o lightheadedness. Gait Pattern: Step-to pattern;Decreased step length - right;Decreased step length - left Gait velocity: slow General Gait Details: Pt with increased pain with initial attempt at mobility. Anticipate she will progress slowly until pain is more controlled Stairs: No Wheelchair Mobility Wheelchair Mobility: No    Exercises Total Joint Exercises Ankle Circles/Pumps: AROM;Both;10 reps;Supine Quad Sets: AROM;Left;10 reps;Supine Short Arc Quad: AAROM;Left;5 reps;Supine Hip ABduction/ADduction: AAROM;Left;5 reps;Supine Straight Leg Raises: AAROM;Left;5 reps;Supine Goniometric ROM: ~ -5 to 30 degrees   PT Diagnosis: Difficulty walking  PT Problem List: Decreased strength;Decreased range of motion;Decreased activity tolerance;Decreased mobility;Pain PT Treatment Interventions: DME instruction;Gait training;Functional mobility training;Therapeutic activities;Therapeutic exercise;Balance training;Patient/family education     PT Goals(Current goals can be found in the care plan section) Acute Rehab PT Goals Patient Stated Goal: to go to [facility] for rehab PT Goal Formulation: With patient Time For Goal Achievement: 11/11/12 Potential to Achieve Goals: Good  Visit Information  Last PT Received On: 10/28/12 Assistance Needed: +2 History of Present Illness: [name], 79 y.o. female, has a history of  pain and functional disability in  the left knee due to arthritis and has failed non-surgical conservative treatments for greater than 12 weeks to includeNSAID's and/or analgesics, corticosteriod injections, viscosupplementation injections, use of assistive devices and activity modification.  Onset of symptoms was gradual, starting 6 years ago with gradually worsening course since that time. The patient noted prior procedures on the knee to include  arthroscopy and menisectomy on the left knee(s).       Prior Functioning  Home Living Family/patient expects to be discharged to:: Skilled nursing facility Living Arrangements: Alone Additional Comments: pt lives alone in one story home with 3 steps with 2 handrails to enter.  Pt has cane, rollator, RW at home Prior Function Level of Independence: Independent with assistive device(s) Communication Communication: No difficulties    Cognition  Cognition Arousal/Alertness: Awake/alert Behavior During Therapy: WFL for tasks assessed/performed Overall Cognitive Status: Within Functional Limits for tasks assessed    Extremity/Trunk Assessment Lower Extremity Assessment Lower Extremity Assessment: LLE deficits/detail;RLE deficits/detail RLE Sensation: history of peripheral neuropathy LLE Deficits / Details: pt with post op dressing in place she is able to ankle pump, quad set to achieve nearly full knee extension,  activate hamstrings to initiate knee flexion to about 30 degress and assist with SLR LLE Sensation: history of peripheral neuropathy Cervical / Trunk Assessment Cervical / Trunk Assessment: Normal   Balance Balance Balance Assessed: Yes Static Sitting Balance Static Sitting - Balance Support: Bilateral upper extremity supported;Feet supported Static Sitting - Level of Assistance: 5: Stand by assistance Static Standing Balance Static Standing - Balance Support: Bilateral upper extremity supported;During functional activity Static  Standing - Level of Assistance: 5: Stand by assistance  End of Session PT - End of Session Equipment Utilized During Treatment: Gait belt Activity Tolerance: Patient limited by pain Patient left: in chair;with family/visitor present Nurse Communication: Mobility status  GP    [name] K. Manson Passey, [place] [phone] 10/28/2012, 9:32 AM

## 2012-10-29 LAB — BASIC METABOLIC PANEL
BUN: 17 mg/dL (ref 6–23)
Calcium: 7.7 mg/dL — ABNORMAL LOW (ref 8.4–10.5)
GFR calc non Af Amer: 79 mL/min — ABNORMAL LOW (ref >90)
Glucose, Bld: 153 mg/dL — ABNORMAL HIGH (ref 70–99)

## 2012-10-29 LAB — CBC
HCT: 25.1 % — ABNORMAL LOW (ref 36.0–46.0)
Hemoglobin: 8.2 g/dL — ABNORMAL LOW (ref 12.0–15.0)
MCH: 29.8 pg (ref 26.0–34.0)
MCHC: 32.7 g/dL (ref 30.0–36.0)

## 2012-10-29 MED FILL — Neomycin-Bacitracin-Polymyxin Oint: CUTANEOUS | Qty: 30 | Status: AC

## 2012-10-29 NOTE — Progress Notes (Signed)
Subjective: 2 Days Post-Op Procedure(s) (LRB): TOTAL KNEE ARTHROPLASTY (Left) Patient reports pain as mild.  Back is more bothersome than knee.  Objective: Vital signs in last 24 hours: Temp:  [98.9 F (37.2 C)-100 F (37.8 C)] 100 F (37.8 C) (08/24 1305) Pulse Rate:  [77-84] 83 (08/24 1305) Resp:  [16-20] 16 (08/24 1305) BP: (91-118)/(44-75) 109/44 mmHg (08/24 1305) SpO2:  [90 %-98 %] 94 % (08/24 1305)  Intake/Output from previous day: 08/23 0701 - 08/24 0700 In: 360 [P.O.:360] Out: 633 [Urine:625; Drains:8] Intake/Output this shift: Total I/O In: 120 [P.O.:120] Out: 100 [Urine:100]   Recent Labs  10/28/12 0535 10/29/12 0442  HGB 9.2* 8.2*    Recent Labs  10/28/12 0535 10/29/12 0442  WBC 13.1* 12.1*  RBC 3.11* 2.75*  HCT 28.3* 25.1*  PLT 282 247    Recent Labs  10/28/12 0535 10/29/12 0442  NA 137 134*  K 3.6 3.3*  CL 104 102  CO2 25 26  BUN 10 17  CREATININE 0.56 0.68  GLUCOSE 141* 153*  CALCIUM 8.1* 7.7*   No results found for this basename: LABPT, INR,  in the last 72 hours  PE:  L knee wound c/i.  sutures in place.  scant SS drainage from the wound.  No signs of infection.  Assessment/Plan: 2 Days Post-Op Procedure(s) (LRB): TOTAL KNEE ARTHROPLASTY (Left) Up with therapy  Dressing changed.  SNF tomorrow.  Toni Arthurs 10/29/2012, 1:57 PM

## 2012-10-29 NOTE — Progress Notes (Signed)
Physical Therapy Treatment Patient Details Name: Megan Page MRN: [id number] DOB: Feb 19, 1929 Today's Date: 10/29/2012 Time: 4098-1191 PT Time Calculation (min): 24 min  PT Assessment / Plan / Recommendation  History of Present Illness Megan Page, 79 y.o. female, has a history of pain and functional disability in the left knee due to arthritis and has failed non-surgical conservative treatments for greater than 12 weeks to includeNSAID's and/or analgesics, corticosteriod injections, viscosupplementation injections, use of assistive devices and activity modification.  Onset of symptoms was gradual, starting 6 years ago with gradually worsening course since that time. The patient noted prior procedures on the knee to include  arthroscopy and menisectomy on the left knee(s).   PT Comments   Pt difficult to keep aroused/groggy/slow/sleepy with eyes shut most od session.  Did assist pt OOB to [facility] to void then amb limited distance.  Severe R lean and difficulty standing upright.  Recliner brought to pt.  Performed TKR TE's AAROM.   Follow Up Recommendations  SNF     Does the patient have the potential to tolerate intense rehabilitation     Barriers to Discharge        Equipment Recommendations       Recommendations for Other Services    Frequency 7X/week   Progress towards PT Goals Progress towards PT goals: Progressing toward goals  Plan Current plan remains appropriate    Precautions / Restrictions Precautions Precautions: Knee;Fall Required Braces or Orthoses: Knee Immobilizer - Left Knee Immobilizer - Left: On when out of bed or walking Restrictions Other Position/Activity Restrictions: WBAT    Pertinent Vitals/Pain C/o 8/10 pain with ACT ICE applied    Mobility  Bed Mobility Bed Mobility: Supine to Sit Supine to Sit: 1: +1 Total assist Supine to Sit: Patient Percentage: 40% Details for Bed Mobility Assistance: HOB elevated and used bed pad to swival hips around to  EOB.  Pt very groggy/sleepy.  Transfers Transfers: Sit to Stand;Stand to Sit Sit to Stand: 1: +2 Total assist Sit to Stand: Patient Percentage: 50% Stand to Sit: 1: +2 Total assist Stand to Sit: Patient Percentage: 50% Details for Transfer Assistance: 75% VC's on proper tech and hand placement. Pt very groggy/eyes closed most of time. Assisted from bed to [facility] with much effort.  Ambulation/Gait Ambulation/Gait Assistance: 1: +2 Total assist Ambulation/Gait: Patient Percentage: 50% Ambulation Distance (Feet): 5 Feet Assistive device: Rolling walker Ambulation/Gait Assistance Details: 75% VC's on upright posture, proper weight shift (severe lean to R), very groggy, unsteady gait.  Gait Pattern: Step-to pattern;Decreased step length - right;Decreased step length - left Gait velocity: slow/groggy    Exercises   Total Knee Replacement TE's 10 reps B LE ankle pumps 10 reps knee presses 10 reps heel slides  10 reps SAQ's 10 reps SLR's 10 reps ABD Followed by ICE   PT Goals (current goals can now be found in the care plan section)    Visit Information  Last PT Received On: 10/29/12 Assistance Needed: +2 History of Present Illness: Megan Page, 79 y.o. female, has a history of pain and functional disability in the left knee due to arthritis and has failed non-surgical conservative treatments for greater than 12 weeks to includeNSAID's and/or analgesics, corticosteriod injections, viscosupplementation injections, use of assistive devices and activity modification.  Onset of symptoms was gradual, starting 6 years ago with gradually worsening course since that time. The patient noted prior procedures on the knee to include  arthroscopy and menisectomy on the left knee(s).    Subjective Data  Cognition       Balance     End of Session PT - End of Session Equipment Utilized During Treatment: Gait belt Activity Tolerance: Patient limited by pain;Patient limited by  fatigue Patient left: in chair;with nursing/sitter in room Nurse Communication: Mobility status   Felecia Shelling  PTA [facility]  Acute  Rehab Pager      [phone]

## 2012-10-29 NOTE — Progress Notes (Signed)
PHYSICAL THERAPY No pm Tx session due c/o back pain and pt back in bed resting comfortably. Felecia Shelling  PTA [facility]  Acute  Rehab Pager      [phone]

## 2012-10-30 ENCOUNTER — Encounter (HOSPITAL_COMMUNITY): Payer: Self-pay | Admitting: Orthopedic Surgery

## 2012-10-30 LAB — CBC WITH DIFFERENTIAL/PLATELET
Basophils Absolute: 0 10*3/uL (ref 0.0–0.1)
Basophils Relative: 0 % (ref 0–1)
Eosinophils Absolute: 0.1 10*3/uL (ref 0.0–0.7)
Eosinophils Relative: 1 % (ref 0–5)
Lymphocytes Relative: 19 % (ref 12–46)
MCHC: 33.2 g/dL (ref 30.0–36.0)
MCV: 90.2 fL (ref 78.0–100.0)
Platelets: 254 10*3/uL (ref 150–400)
RDW: 15.1 % (ref 11.5–15.5)
WBC: 10.2 10*3/uL (ref 4.0–10.5)

## 2012-10-30 LAB — CBC
Hemoglobin: 7.6 g/dL — ABNORMAL LOW (ref 12.0–15.0)
MCH: 29.8 pg (ref 26.0–34.0)
MCHC: 32.9 g/dL (ref 30.0–36.0)

## 2012-10-30 LAB — PREPARE RBC (CROSSMATCH)

## 2012-10-30 NOTE — Progress Notes (Signed)
Utilization review completed.  

## 2012-10-30 NOTE — Progress Notes (Signed)
CSW followed up with RN re: pt Hgb. Pt requiring 2 units of blood and not stable for d/c today.  CSW contacted [facility] to notify and [facility] confirmed that they could accept pt tomorrow if pt medically stable for d/c tomorrow.  CSW to update pt at bedside.  CSW to facilitate pt discharge needs when pt medically stable for discharge.   Jacklynn Lewis, MSW, LCSWA  Clinical Social Work [phone]

## 2012-10-30 NOTE — Progress Notes (Signed)
PT Cancellation Note  ___Treatment cancelled today due to medical issues with patient which prohibited therapy  ___ Treatment cancelled today due to patient receiving procedure or test   ___ Treatment cancelled today due to patient's refusal to participate   _X_ AM PT session cancelled today due to low HgB  Felecia Shelling  PTA [facility]  Acute  Rehab Pager      [phone]

## 2012-10-30 NOTE — Progress Notes (Signed)
Pt is complaining of Overall Weakness and Exhaustion and unable to work with PT due to low hgb. Pt will be receiving 2 units PRBC and follow up tomorrow with labs and discharge planning.

## 2012-10-30 NOTE — Progress Notes (Signed)
Subjective: 3 Days Post-Op Procedure(s) (LRB): TOTAL KNEE ARTHROPLASTY (Left) Patient reports pain as 2 on 0-10 scale.Hbg 7.6 and has been decreasing. Will repeat at noon and if stable will not transfuse. Diffuse hematoma about her leg. Moves foot well.    Objective: Vital signs in last 24 hours: Temp:  [98.6 F (37 C)-100 F (37.8 C)] 98.9 F (37.2 C) (08/25 0425) Pulse Rate:  [80-84] 80 (08/25 0425) Resp:  [16-20] 16 (08/25 0425) BP: (91-117)/(44-69) 113/69 mmHg (08/25 0425) SpO2:  [91 %-95 %] 95 % (08/25 0425)  Intake/Output from previous day: 08/24 0701 - 08/25 0700 In: 360 [P.O.:360] Out: 2400 [Urine:2400] Intake/Output this shift: Total I/O In: 0  Out: 2300 [Urine:2300]   Recent Labs  10/28/12 0535 10/29/12 0442 10/30/12 0420  HGB 9.2* 8.2* 7.6*    Recent Labs  10/29/12 0442 10/30/12 0420  WBC 12.1* 10.1  RBC 2.75* 2.55*  HCT 25.1* 23.1*  PLT 247 253    Recent Labs  10/28/12 0535 10/29/12 0442  NA 137 134*  K 3.6 3.3*  CL 104 102  CO2 25 26  BUN 10 17  CREATININE 0.56 0.68  GLUCOSE 141* 153*  CALCIUM 8.1* 7.7*   No results found for this basename: LABPT, INR,  in the last 72 hours  Dorsiflexion/Plantar flexion intact  Assessment/Plan: 3 Days Post-Op Procedure(s) (LRB): TOTAL KNEE ARTHROPLASTY (Left) Up with therapy  Maudine Kluesner A 10/30/2012, 6:47 AM

## 2012-10-30 NOTE — Progress Notes (Signed)
[facility] was able to offer pt a bed and has bed availability.  Awaiting results from pt Hgb check to determine if pt medically ready today.  CSW to continue to follow for potential d/c today.   Jacklynn Lewis, MSW, LCSWA  Clinical Social Work [phone]

## 2012-10-30 NOTE — Progress Notes (Signed)
CSW met with pt at bedside to discuss SNF placement.  Pt reports that her first choice is [facility] and CSW explained that admission coordinator does not arrive to facility until 10 am, but CSW has left voice message.  CSW encouraged pt to have other options for SNF in mind in the instance that [facility] does not have bed availability. Pt agreeable to CSW contacted pt daughter to discuss. Per pt daughter, other options would be [facility].  CSW contacted [facility] at [facility] and facility does not have any bed availability at this time.  CSW left message with [facility].  CSW to follow up with [facility]  to determine if facility has bed availability.  Per pt and MD notes, pt Hbg is going to be rechecked at noon to determine if it is stable or if she will require a blood transfusion.  If pt Hgb is stable, pt will be medically ready for discharge today per RN and MD notes.  CSW to continue to follow.   Jacklynn Lewis, MSW, LCSWA  Clinical Social Work Coverage for [employer] [phone]

## 2012-10-30 NOTE — Discharge Summary (Signed)
Physician Discharge Summary  Patient ID: Megan Page MRN: [id number] DOB/AGE: 11-07-1928 79 y.o.  Admit date: 10/27/2012 Discharge date: 10/30/2012  Admission Diagnoses:Osteoarthritis of Left Knee  Discharge Diagnoses: Osteoarthritis of Left Knee Active Problems:   Osteoarthritis of left knee   Discharged Condition: good  Hospital Course: Ambulated with PT  Consults: rehabilitation medicine  Significant Diagnostic Studies: labs: Hemoglobin Follow-up  Treatments: Physical Therapy  Discharge Exam: Blood pressure 113/69, pulse 80, temperature 98.9 F (37.2 C), temperature source Oral, resp. rate 16, height 5\' 4"  (1.626 m), weight 86.183 kg (190 lb), SpO2 95.00%. Extremities: Diffuse Hematoma of Left Leg  Disposition: Final discharge disposition not confirmed  Discharge Orders   Future Orders Complete By Expires   Call MD / Call 911  As directed    Comments:     If you experience chest pain or shortness of breath, CALL 911 and be transported to the hospital emergency room.  If you develope a fever above 101 F, pus (white drainage) or increased drainage or redness at the wound, or calf pain, call your surgeon's office.   Discharge instructions  As directed    Comments:     Walk with your walker. Weight bearing as instructed. Maintain Xarelto 10mg m as Ordered. Shower only, no tub bath. Call if any temperatures greater than 101 or any wound complications: [phone] during the day and ask for Dr. Jeannetta Ellis nurse, [facility] [place].   Driving restrictions  As directed    Comments:     No driving for two weeks   Increase activity slowly as tolerated  As directed        Medication List         ammonium lactate 12 % cream  Commonly known as:  AMLACTIN  Apply 5 g topically 2 (two) times daily. Lac-hydrin     anastrozole 1 MG tablet  Commonly known as:  ARIMIDEX  Take 1 mg by mouth daily.     calcium carbonate 1250 MG tablet  Commonly known as:  OS-CAL - dosed in mg of  elemental calcium  Take 1 tablet by mouth.     carvedilol 25 MG tablet  Commonly known as:  COREG  Take 25 mg by mouth 2 (two) times daily with a meal.     cholecalciferol 1000 UNITS tablet  Commonly known as:  VITAMIN D  Take 1,000 Units by mouth daily.     diphenhydrAMINE 25 mg capsule  Commonly known as:  BENADRYL  Take 25 mg by mouth at bedtime as needed for allergies.     fluticasone 50 MCG/ACT nasal spray  Commonly known as:  FLONASE  Place 1 spray into the nose daily as needed for allergies.     gabapentin 100 MG capsule  Commonly known as:  NEURONTIN  Take 300 mg by mouth 3 (three) times daily. Can take up to 9 capsules daily     hydrALAZINE 50 MG tablet  Commonly known as:  APRESOLINE  Take 50-100 mg by mouth 3 (three) times daily. Take one in the am, one at noon, and two at betime     HYDROcodone-acetaminophen 5-325 MG per tablet  Commonly known as:  NORCO/VICODIN  Take 1-2 tablets by mouth every 4 (four) hours as needed.     lansoprazole 15 MG capsule  Commonly known as:  PREVACID  Take 15 mg by mouth daily.     losartan 100 MG tablet  Commonly known as:  COZAAR  Take 100 mg by mouth every morning.  methocarbamol 500 MG tablet  Commonly known as:  ROBAXIN  Take 1 tablet (500 mg total) by mouth every 6 (six) hours as needed.     oxymetazoline 0.05 % nasal spray  Commonly known as:  AFRIN  Place 2 sprays into the nose daily as needed for congestion.     pravastatin 20 MG tablet  Commonly known as:  PRAVACHOL  Take 20 mg by mouth daily.     rivaroxaban 10 MG Tabs tablet  Commonly known as:  XARELTO  Take 1 tablet (10 mg total) by mouth daily with breakfast.     solifenacin 5 MG tablet  Commonly known as:  VESICARE  Take 10 mg by mouth daily.     triamcinolone 0.025 % cream  Commonly known as:  KENALOG  Apply 1 application topically 2 (two) times daily as needed. Apply to legs     zolpidem 10 MG tablet  Commonly known as:  AMBIEN  Take 10 mg  by mouth at bedtime as needed for sleep.         Signed: Abdulraheem Pineo A 10/30/2012, 6:51 AM

## 2012-10-31 ENCOUNTER — Other Ambulatory Visit: Payer: Self-pay | Admitting: Orthopedic Surgery

## 2012-10-31 DIAGNOSIS — G8918 Other acute postprocedural pain: Secondary | ICD-10-CM | POA: Diagnosis not present

## 2012-10-31 DIAGNOSIS — Z96659 Presence of unspecified artificial knee joint: Secondary | ICD-10-CM | POA: Diagnosis not present

## 2012-10-31 DIAGNOSIS — S8990XA Unspecified injury of unspecified lower leg, initial encounter: Secondary | ICD-10-CM | POA: Diagnosis not present

## 2012-10-31 DIAGNOSIS — Z471 Aftercare following joint replacement surgery: Secondary | ICD-10-CM | POA: Diagnosis not present

## 2012-10-31 DIAGNOSIS — M1710 Unilateral primary osteoarthritis, unspecified knee: Secondary | ICD-10-CM | POA: Diagnosis not present

## 2012-10-31 DIAGNOSIS — I130 Hypertensive heart and chronic kidney disease with heart failure and stage 1 through stage 4 chronic kidney disease, or unspecified chronic kidney disease: Secondary | ICD-10-CM | POA: Diagnosis not present

## 2012-10-31 DIAGNOSIS — D638 Anemia in other chronic diseases classified elsewhere: Secondary | ICD-10-CM | POA: Diagnosis not present

## 2012-10-31 DIAGNOSIS — M25569 Pain in unspecified knee: Secondary | ICD-10-CM | POA: Diagnosis not present

## 2012-10-31 DIAGNOSIS — D62 Acute posthemorrhagic anemia: Secondary | ICD-10-CM | POA: Diagnosis not present

## 2012-10-31 LAB — TYPE AND SCREEN
ABO/RH(D): O POS
Antibody Screen: NEGATIVE
Unit division: 0
Unit division: 0

## 2012-10-31 LAB — CBC
Hemoglobin: 10.1 g/dL — ABNORMAL LOW (ref 12.0–15.0)
MCHC: 33.9 g/dL (ref 30.0–36.0)
Platelets: 289 10*3/uL (ref 150–400)
RDW: 16.5 % — ABNORMAL HIGH (ref 11.5–15.5)

## 2012-10-31 MED ORDER — HYDROMORPHONE HCL PF 1 MG/ML IJ SOLN
INTRAMUSCULAR | Status: AC
  Filled 2012-10-31: qty 1

## 2012-10-31 MED ORDER — ZOLPIDEM TARTRATE 5 MG PO TABS: 5.0000 mg | ORAL_TABLET | Freq: Every evening | ORAL | Status: DC | PRN

## 2012-10-31 NOTE — Progress Notes (Signed)
Clinical Social Work Department CLINICAL SOCIAL WORK PLACEMENT NOTE 10/31/2012  Patient:  Megan Page, Megan Page  Account Number:  [id number] Admit date:  10/27/2012  Clinical Social Worker:  Doroteo Glassman  Date/time:  10/28/2012 10:49 AM  Clinical Social Work is seeking post-discharge placement for this patient at the following level of care:   SKILLED NURSING   (*CSW will update this form in Epic as items are completed)   10/28/2012  Patient/family provided with [facility] Department of Clinical Social Work's list of facilities offering this level of care within the geographic area requested by the patient (or if unable, by the patient's family).  10/28/2012  Patient/family informed of their freedom to choose among providers that offer the needed level of care, that participate in Medicare, Medicaid or managed care program needed by the patient, have an available bed and are willing to accept the patient.  10/28/2012  Patient/family informed of [facility]' ownership interest in [facility], as well as of the fact that they are under no obligation to receive care at this facility.  PASARR submitted to EDS on 10/28/2012 PASARR number received from EDS on 10/28/2012  FL2 transmitted to all facilities in geographic area requested by pt/family on  10/28/2012 FL2 transmitted to all facilities within larger geographic area on   Patient informed that his/her managed care company has contracts with or will negotiate with  certain facilities, including the following:     Patient/family informed of bed offers received:  10/30/2012 Patient chooses bed at  Physician recommends and patient chooses bed at    Patient to be transferred to [facility]  on  10/31/2012 Patient to be transferred to [facility] facility by [facility]  The following physician request were entered in Epic:   Additional Comments:  Cori Razor LCSW [phone]

## 2012-10-31 NOTE — Progress Notes (Signed)
Subjective: 4 Days Post-Op Procedure(s) (LRB): TOTAL KNEE ARTHROPLASTY (Left) Patient reports pain as 2 on 0-10 scale.  Patient was transfused because of Extreme letharghy and inability to ambulate because of weakness. Also she had a decreasing Hbg and is 84 yrs. Old. She is much better today after her transfusion and states she feels as though all of her strength is back. My concern in this 79yr old patient was the risk of a heart attack with a Hbg this low and at the same time ,decreasing.  Objective: Vital signs in last 24 hours: Temp:  [98.5 F (36.9 C)-99.2 F (37.3 C)] 98.8 F (37.1 C) (08/26 0624) Pulse Rate:  [69-80] 69 (08/26 0624) Resp:  [14-18] 14 (08/26 0624) BP: (94-162)/(58-84) 162/80 mmHg (08/26 0810) SpO2:  [94 %-98 %] 95 % (08/26 0624)  Intake/Output from previous day: 08/25 0701 - 08/26 0700 In: 1511.7 [P.O.:480; I.V.:340; Blood:691.7] Out: 1250 [Urine:1250] Intake/Output this shift:     Recent Labs  10/29/12 0442 10/30/12 0420 10/30/12 1150 10/31/12 0415  HGB 8.2* 7.6* 7.3* 10.1*    Recent Labs  10/30/12 1150 10/31/12 0415  WBC 10.2 11.2*  RBC 2.44* 3.42*  HCT 22.0* 29.8*  PLT 254 289    Recent Labs  10/29/12 0442  NA 134*  K 3.3*  CL 102  CO2 26  BUN 17  CREATININE 0.68  GLUCOSE 153*  CALCIUM 7.7*   No results found for this basename: LABPT, INR,  in the last 72 hours  Dorsiflexion/Plantar flexion intact  Assessment/Plan: 4 Days Post-Op Procedure(s) (LRB): TOTAL KNEE ARTHROPLASTY (Left) Discharge to SNF  Megan Page A 10/31/2012, 8:55 AM

## 2012-11-02 DIAGNOSIS — D638 Anemia in other chronic diseases classified elsewhere: Secondary | ICD-10-CM | POA: Diagnosis not present

## 2012-11-02 DIAGNOSIS — I130 Hypertensive heart and chronic kidney disease with heart failure and stage 1 through stage 4 chronic kidney disease, or unspecified chronic kidney disease: Secondary | ICD-10-CM | POA: Diagnosis not present

## 2012-11-02 DIAGNOSIS — G8918 Other acute postprocedural pain: Secondary | ICD-10-CM | POA: Diagnosis not present

## 2012-11-02 DIAGNOSIS — Z96659 Presence of unspecified artificial knee joint: Secondary | ICD-10-CM | POA: Diagnosis not present

## 2012-11-18 DIAGNOSIS — R32 Unspecified urinary incontinence: Secondary | ICD-10-CM | POA: Diagnosis not present

## 2012-11-18 DIAGNOSIS — Z96659 Presence of unspecified artificial knee joint: Secondary | ICD-10-CM | POA: Diagnosis not present

## 2012-11-18 DIAGNOSIS — W06XXXA Fall from bed, initial encounter: Secondary | ICD-10-CM | POA: Diagnosis not present

## 2012-11-18 DIAGNOSIS — Z471 Aftercare following joint replacement surgery: Secondary | ICD-10-CM | POA: Diagnosis not present

## 2012-11-18 DIAGNOSIS — IMO0001 Reserved for inherently not codable concepts without codable children: Secondary | ICD-10-CM | POA: Diagnosis not present

## 2012-11-20 DIAGNOSIS — R32 Unspecified urinary incontinence: Secondary | ICD-10-CM | POA: Diagnosis not present

## 2012-11-20 DIAGNOSIS — Z471 Aftercare following joint replacement surgery: Secondary | ICD-10-CM | POA: Diagnosis not present

## 2012-11-20 DIAGNOSIS — Z96659 Presence of unspecified artificial knee joint: Secondary | ICD-10-CM | POA: Diagnosis not present

## 2012-11-20 DIAGNOSIS — IMO0001 Reserved for inherently not codable concepts without codable children: Secondary | ICD-10-CM | POA: Diagnosis not present

## 2012-11-20 DIAGNOSIS — W06XXXA Fall from bed, initial encounter: Secondary | ICD-10-CM | POA: Diagnosis not present

## 2012-11-21 DIAGNOSIS — Z471 Aftercare following joint replacement surgery: Secondary | ICD-10-CM | POA: Diagnosis not present

## 2012-11-21 DIAGNOSIS — IMO0001 Reserved for inherently not codable concepts without codable children: Secondary | ICD-10-CM | POA: Diagnosis not present

## 2012-11-21 DIAGNOSIS — W06XXXA Fall from bed, initial encounter: Secondary | ICD-10-CM | POA: Diagnosis not present

## 2012-11-21 DIAGNOSIS — Z96659 Presence of unspecified artificial knee joint: Secondary | ICD-10-CM | POA: Diagnosis not present

## 2012-11-21 DIAGNOSIS — R32 Unspecified urinary incontinence: Secondary | ICD-10-CM | POA: Diagnosis not present

## 2012-11-22 DIAGNOSIS — Z471 Aftercare following joint replacement surgery: Secondary | ICD-10-CM | POA: Diagnosis not present

## 2012-11-22 DIAGNOSIS — Z96659 Presence of unspecified artificial knee joint: Secondary | ICD-10-CM | POA: Diagnosis not present

## 2012-11-22 DIAGNOSIS — W06XXXA Fall from bed, initial encounter: Secondary | ICD-10-CM | POA: Diagnosis not present

## 2012-11-22 DIAGNOSIS — IMO0001 Reserved for inherently not codable concepts without codable children: Secondary | ICD-10-CM | POA: Diagnosis not present

## 2012-11-22 DIAGNOSIS — R32 Unspecified urinary incontinence: Secondary | ICD-10-CM | POA: Diagnosis not present

## 2012-11-23 DIAGNOSIS — Z96659 Presence of unspecified artificial knee joint: Secondary | ICD-10-CM | POA: Diagnosis not present

## 2012-11-23 DIAGNOSIS — R32 Unspecified urinary incontinence: Secondary | ICD-10-CM | POA: Diagnosis not present

## 2012-11-23 DIAGNOSIS — IMO0001 Reserved for inherently not codable concepts without codable children: Secondary | ICD-10-CM | POA: Diagnosis not present

## 2012-11-23 DIAGNOSIS — W06XXXA Fall from bed, initial encounter: Secondary | ICD-10-CM | POA: Diagnosis not present

## 2012-11-23 DIAGNOSIS — Z471 Aftercare following joint replacement surgery: Secondary | ICD-10-CM | POA: Diagnosis not present

## 2012-11-24 DIAGNOSIS — W06XXXA Fall from bed, initial encounter: Secondary | ICD-10-CM | POA: Diagnosis not present

## 2012-11-24 DIAGNOSIS — Z96659 Presence of unspecified artificial knee joint: Secondary | ICD-10-CM | POA: Diagnosis not present

## 2012-11-24 DIAGNOSIS — IMO0001 Reserved for inherently not codable concepts without codable children: Secondary | ICD-10-CM | POA: Diagnosis not present

## 2012-11-24 DIAGNOSIS — Z4789 Encounter for other orthopedic aftercare: Secondary | ICD-10-CM | POA: Diagnosis not present

## 2012-11-24 DIAGNOSIS — R32 Unspecified urinary incontinence: Secondary | ICD-10-CM | POA: Diagnosis not present

## 2012-11-24 DIAGNOSIS — Z471 Aftercare following joint replacement surgery: Secondary | ICD-10-CM | POA: Diagnosis not present

## 2012-11-26 DIAGNOSIS — W06XXXA Fall from bed, initial encounter: Secondary | ICD-10-CM | POA: Diagnosis not present

## 2012-11-26 DIAGNOSIS — Z471 Aftercare following joint replacement surgery: Secondary | ICD-10-CM | POA: Diagnosis not present

## 2012-11-26 DIAGNOSIS — Z96659 Presence of unspecified artificial knee joint: Secondary | ICD-10-CM | POA: Diagnosis not present

## 2012-11-26 DIAGNOSIS — IMO0001 Reserved for inherently not codable concepts without codable children: Secondary | ICD-10-CM | POA: Diagnosis not present

## 2012-11-26 DIAGNOSIS — R32 Unspecified urinary incontinence: Secondary | ICD-10-CM | POA: Diagnosis not present

## 2012-11-30 DIAGNOSIS — M1710 Unilateral primary osteoarthritis, unspecified knee: Secondary | ICD-10-CM | POA: Diagnosis not present

## 2012-11-30 DIAGNOSIS — R262 Difficulty in walking, not elsewhere classified: Secondary | ICD-10-CM | POA: Diagnosis not present

## 2012-12-01 DIAGNOSIS — G4700 Insomnia, unspecified: Secondary | ICD-10-CM | POA: Diagnosis not present

## 2012-12-01 DIAGNOSIS — I10 Essential (primary) hypertension: Secondary | ICD-10-CM | POA: Diagnosis not present

## 2012-12-01 DIAGNOSIS — Z79899 Other long term (current) drug therapy: Secondary | ICD-10-CM | POA: Diagnosis not present

## 2012-12-01 DIAGNOSIS — D539 Nutritional anemia, unspecified: Secondary | ICD-10-CM | POA: Diagnosis not present

## 2012-12-04 DIAGNOSIS — R262 Difficulty in walking, not elsewhere classified: Secondary | ICD-10-CM | POA: Diagnosis not present

## 2012-12-04 DIAGNOSIS — M1710 Unilateral primary osteoarthritis, unspecified knee: Secondary | ICD-10-CM | POA: Diagnosis not present

## 2012-12-06 DIAGNOSIS — R262 Difficulty in walking, not elsewhere classified: Secondary | ICD-10-CM | POA: Diagnosis not present

## 2012-12-06 DIAGNOSIS — M1710 Unilateral primary osteoarthritis, unspecified knee: Secondary | ICD-10-CM | POA: Diagnosis not present

## 2012-12-07 DIAGNOSIS — R262 Difficulty in walking, not elsewhere classified: Secondary | ICD-10-CM | POA: Diagnosis not present

## 2012-12-07 DIAGNOSIS — M1710 Unilateral primary osteoarthritis, unspecified knee: Secondary | ICD-10-CM | POA: Diagnosis not present

## 2012-12-08 DIAGNOSIS — Z23 Encounter for immunization: Secondary | ICD-10-CM | POA: Diagnosis not present

## 2012-12-11 DIAGNOSIS — M1710 Unilateral primary osteoarthritis, unspecified knee: Secondary | ICD-10-CM | POA: Diagnosis not present

## 2012-12-11 DIAGNOSIS — R262 Difficulty in walking, not elsewhere classified: Secondary | ICD-10-CM | POA: Diagnosis not present

## 2012-12-13 DIAGNOSIS — R262 Difficulty in walking, not elsewhere classified: Secondary | ICD-10-CM | POA: Diagnosis not present

## 2012-12-13 DIAGNOSIS — M1710 Unilateral primary osteoarthritis, unspecified knee: Secondary | ICD-10-CM | POA: Diagnosis not present

## 2012-12-18 DIAGNOSIS — R262 Difficulty in walking, not elsewhere classified: Secondary | ICD-10-CM | POA: Diagnosis not present

## 2012-12-18 DIAGNOSIS — M1710 Unilateral primary osteoarthritis, unspecified knee: Secondary | ICD-10-CM | POA: Diagnosis not present

## 2012-12-19 DIAGNOSIS — M1710 Unilateral primary osteoarthritis, unspecified knee: Secondary | ICD-10-CM | POA: Diagnosis not present

## 2012-12-19 DIAGNOSIS — R262 Difficulty in walking, not elsewhere classified: Secondary | ICD-10-CM | POA: Diagnosis not present

## 2012-12-21 DIAGNOSIS — R262 Difficulty in walking, not elsewhere classified: Secondary | ICD-10-CM | POA: Diagnosis not present

## 2012-12-21 DIAGNOSIS — M1710 Unilateral primary osteoarthritis, unspecified knee: Secondary | ICD-10-CM | POA: Diagnosis not present

## 2012-12-25 DIAGNOSIS — D509 Iron deficiency anemia, unspecified: Secondary | ICD-10-CM | POA: Diagnosis not present

## 2012-12-25 DIAGNOSIS — R262 Difficulty in walking, not elsewhere classified: Secondary | ICD-10-CM | POA: Diagnosis not present

## 2012-12-25 DIAGNOSIS — M1710 Unilateral primary osteoarthritis, unspecified knee: Secondary | ICD-10-CM | POA: Diagnosis not present

## 2012-12-25 DIAGNOSIS — I10 Essential (primary) hypertension: Secondary | ICD-10-CM | POA: Diagnosis not present

## 2012-12-27 DIAGNOSIS — R262 Difficulty in walking, not elsewhere classified: Secondary | ICD-10-CM | POA: Diagnosis not present

## 2012-12-27 DIAGNOSIS — M1710 Unilateral primary osteoarthritis, unspecified knee: Secondary | ICD-10-CM | POA: Diagnosis not present

## 2012-12-29 DIAGNOSIS — R262 Difficulty in walking, not elsewhere classified: Secondary | ICD-10-CM | POA: Diagnosis not present

## 2012-12-29 DIAGNOSIS — M1710 Unilateral primary osteoarthritis, unspecified knee: Secondary | ICD-10-CM | POA: Diagnosis not present

## 2013-01-01 DIAGNOSIS — M1710 Unilateral primary osteoarthritis, unspecified knee: Secondary | ICD-10-CM | POA: Diagnosis not present

## 2013-01-01 DIAGNOSIS — R262 Difficulty in walking, not elsewhere classified: Secondary | ICD-10-CM | POA: Diagnosis not present

## 2013-01-03 DIAGNOSIS — M1710 Unilateral primary osteoarthritis, unspecified knee: Secondary | ICD-10-CM | POA: Diagnosis not present

## 2013-01-03 DIAGNOSIS — R262 Difficulty in walking, not elsewhere classified: Secondary | ICD-10-CM | POA: Diagnosis not present

## 2013-01-08 DIAGNOSIS — M549 Dorsalgia, unspecified: Secondary | ICD-10-CM | POA: Diagnosis not present

## 2013-01-08 DIAGNOSIS — I10 Essential (primary) hypertension: Secondary | ICD-10-CM | POA: Diagnosis not present

## 2013-01-16 DIAGNOSIS — G603 Idiopathic progressive neuropathy: Secondary | ICD-10-CM | POA: Diagnosis not present

## 2013-01-16 DIAGNOSIS — IMO0002 Reserved for concepts with insufficient information to code with codable children: Secondary | ICD-10-CM | POA: Diagnosis not present

## 2013-01-16 DIAGNOSIS — G4700 Insomnia, unspecified: Secondary | ICD-10-CM | POA: Diagnosis not present

## 2013-01-17 DIAGNOSIS — C50419 Malignant neoplasm of upper-outer quadrant of unspecified female breast: Secondary | ICD-10-CM | POA: Diagnosis not present

## 2013-01-17 DIAGNOSIS — M810 Age-related osteoporosis without current pathological fracture: Secondary | ICD-10-CM | POA: Diagnosis not present

## 2013-01-17 DIAGNOSIS — Z13820 Encounter for screening for osteoporosis: Secondary | ICD-10-CM | POA: Diagnosis not present

## 2013-01-18 DIAGNOSIS — R32 Unspecified urinary incontinence: Secondary | ICD-10-CM | POA: Diagnosis not present

## 2013-01-18 DIAGNOSIS — I10 Essential (primary) hypertension: Secondary | ICD-10-CM | POA: Diagnosis not present

## 2013-01-18 DIAGNOSIS — R5381 Other malaise: Secondary | ICD-10-CM | POA: Diagnosis not present

## 2013-01-18 DIAGNOSIS — D509 Iron deficiency anemia, unspecified: Secondary | ICD-10-CM | POA: Diagnosis not present

## 2013-01-18 DIAGNOSIS — Z79899 Other long term (current) drug therapy: Secondary | ICD-10-CM | POA: Diagnosis not present

## 2013-01-18 DIAGNOSIS — M549 Dorsalgia, unspecified: Secondary | ICD-10-CM | POA: Diagnosis not present

## 2013-01-18 DIAGNOSIS — R946 Abnormal results of thyroid function studies: Secondary | ICD-10-CM | POA: Diagnosis not present

## 2013-02-16 DIAGNOSIS — M48061 Spinal stenosis, lumbar region without neurogenic claudication: Secondary | ICD-10-CM | POA: Diagnosis not present

## 2013-02-22 DIAGNOSIS — G4700 Insomnia, unspecified: Secondary | ICD-10-CM | POA: Diagnosis not present

## 2013-02-22 DIAGNOSIS — I10 Essential (primary) hypertension: Secondary | ICD-10-CM | POA: Diagnosis not present

## 2013-03-27 DIAGNOSIS — Z79899 Other long term (current) drug therapy: Secondary | ICD-10-CM | POA: Diagnosis not present

## 2013-03-27 DIAGNOSIS — M549 Dorsalgia, unspecified: Secondary | ICD-10-CM | POA: Diagnosis not present

## 2013-03-27 DIAGNOSIS — J019 Acute sinusitis, unspecified: Secondary | ICD-10-CM | POA: Diagnosis not present

## 2013-03-27 DIAGNOSIS — I10 Essential (primary) hypertension: Secondary | ICD-10-CM | POA: Diagnosis not present

## 2013-03-27 DIAGNOSIS — E785 Hyperlipidemia, unspecified: Secondary | ICD-10-CM | POA: Diagnosis not present

## 2013-03-27 DIAGNOSIS — D509 Iron deficiency anemia, unspecified: Secondary | ICD-10-CM | POA: Diagnosis not present

## 2013-03-30 DIAGNOSIS — Z96659 Presence of unspecified artificial knee joint: Secondary | ICD-10-CM | POA: Diagnosis not present

## 2013-03-30 DIAGNOSIS — M48061 Spinal stenosis, lumbar region without neurogenic claudication: Secondary | ICD-10-CM | POA: Diagnosis not present

## 2013-04-30 DIAGNOSIS — C50419 Malignant neoplasm of upper-outer quadrant of unspecified female breast: Secondary | ICD-10-CM | POA: Diagnosis not present

## 2013-05-02 DIAGNOSIS — J0100 Acute maxillary sinusitis, unspecified: Secondary | ICD-10-CM | POA: Diagnosis not present

## 2013-05-10 DIAGNOSIS — Z9889 Other specified postprocedural states: Secondary | ICD-10-CM | POA: Diagnosis not present

## 2013-05-10 DIAGNOSIS — Z853 Personal history of malignant neoplasm of breast: Secondary | ICD-10-CM | POA: Diagnosis not present

## 2013-05-10 DIAGNOSIS — R928 Other abnormal and inconclusive findings on diagnostic imaging of breast: Secondary | ICD-10-CM | POA: Diagnosis not present

## 2013-05-15 DIAGNOSIS — C50419 Malignant neoplasm of upper-outer quadrant of unspecified female breast: Secondary | ICD-10-CM | POA: Diagnosis not present

## 2013-05-15 DIAGNOSIS — E669 Obesity, unspecified: Secondary | ICD-10-CM | POA: Diagnosis not present

## 2013-05-15 DIAGNOSIS — C773 Secondary and unspecified malignant neoplasm of axilla and upper limb lymph nodes: Secondary | ICD-10-CM | POA: Diagnosis not present

## 2013-06-26 DIAGNOSIS — G4700 Insomnia, unspecified: Secondary | ICD-10-CM | POA: Diagnosis not present

## 2013-06-26 DIAGNOSIS — R609 Edema, unspecified: Secondary | ICD-10-CM | POA: Diagnosis not present

## 2013-06-26 DIAGNOSIS — E785 Hyperlipidemia, unspecified: Secondary | ICD-10-CM | POA: Diagnosis not present

## 2013-06-26 DIAGNOSIS — M5137 Other intervertebral disc degeneration, lumbosacral region: Secondary | ICD-10-CM | POA: Diagnosis not present

## 2013-06-26 DIAGNOSIS — D509 Iron deficiency anemia, unspecified: Secondary | ICD-10-CM | POA: Diagnosis not present

## 2013-06-26 DIAGNOSIS — Z79899 Other long term (current) drug therapy: Secondary | ICD-10-CM | POA: Diagnosis not present

## 2013-06-26 DIAGNOSIS — I10 Essential (primary) hypertension: Secondary | ICD-10-CM | POA: Diagnosis not present

## 2013-07-10 DIAGNOSIS — M48061 Spinal stenosis, lumbar region without neurogenic claudication: Secondary | ICD-10-CM | POA: Diagnosis not present

## 2013-07-10 DIAGNOSIS — Z96659 Presence of unspecified artificial knee joint: Secondary | ICD-10-CM | POA: Diagnosis not present

## 2013-07-17 DIAGNOSIS — G603 Idiopathic progressive neuropathy: Secondary | ICD-10-CM | POA: Diagnosis not present

## 2013-07-23 DIAGNOSIS — M48061 Spinal stenosis, lumbar region without neurogenic claudication: Secondary | ICD-10-CM | POA: Diagnosis not present

## 2013-07-23 DIAGNOSIS — M5126 Other intervertebral disc displacement, lumbar region: Secondary | ICD-10-CM | POA: Diagnosis not present

## 2013-07-23 DIAGNOSIS — S32009A Unspecified fracture of unspecified lumbar vertebra, initial encounter for closed fracture: Secondary | ICD-10-CM | POA: Diagnosis not present

## 2013-07-31 DIAGNOSIS — M48061 Spinal stenosis, lumbar region without neurogenic claudication: Secondary | ICD-10-CM | POA: Diagnosis not present

## 2013-08-02 DIAGNOSIS — I10 Essential (primary) hypertension: Secondary | ICD-10-CM | POA: Diagnosis not present

## 2013-08-02 DIAGNOSIS — Z01818 Encounter for other preprocedural examination: Secondary | ICD-10-CM | POA: Diagnosis not present

## 2013-08-22 DIAGNOSIS — M48061 Spinal stenosis, lumbar region without neurogenic claudication: Secondary | ICD-10-CM | POA: Diagnosis not present

## 2013-08-31 ENCOUNTER — Encounter (HOSPITAL_COMMUNITY): Payer: Self-pay

## 2013-09-04 ENCOUNTER — Encounter (HOSPITAL_COMMUNITY)
Admission: RE | Admit: 2013-09-04 | Discharge: 2013-09-04 | Disposition: A | Discharge status: Home / Self Care | Payer: Medicare Other | Source: Ambulatory Visit | Attending: Orthopedic Surgery | Admitting: Orthopedic Surgery

## 2013-09-04 ENCOUNTER — Encounter (HOSPITAL_COMMUNITY)
Admission: RE | Admit: 2013-09-04 | Discharge: 2013-09-04 | Disposition: A | Discharge status: Home / Self Care | Payer: Medicare Other | Source: Ambulatory Visit | Attending: Anesthesiology | Admitting: Anesthesiology

## 2013-09-04 ENCOUNTER — Encounter (HOSPITAL_COMMUNITY): Payer: Self-pay

## 2013-09-04 ENCOUNTER — Other Ambulatory Visit (HOSPITAL_COMMUNITY): Payer: Self-pay | Admitting: *Deleted

## 2013-09-04 DIAGNOSIS — Z01812 Encounter for preprocedural laboratory examination: Secondary | ICD-10-CM | POA: Insufficient documentation

## 2013-09-04 DIAGNOSIS — Z01818 Encounter for other preprocedural examination: Secondary | ICD-10-CM | POA: Insufficient documentation

## 2013-09-04 HISTORY — DX: Personal history of other medical treatment: Z92.89

## 2013-09-04 HISTORY — DX: Irritable bowel syndrome, unspecified: K58.9

## 2013-09-04 LAB — CBC
HCT: 40.5 % (ref 36.0–46.0)
Hemoglobin: 12.8 g/dL (ref 12.0–15.0)
MCH: 30 pg (ref 26.0–34.0)
MCHC: 31.6 g/dL (ref 30.0–36.0)
MCV: 95.1 fL (ref 78.0–100.0)
Platelets: 306 10*3/uL (ref 150–400)
RBC: 4.26 MIL/uL (ref 3.87–5.11)
RDW: 14.9 % (ref 11.5–15.5)
WBC: 9.2 10*3/uL (ref 4.0–10.5)

## 2013-09-04 LAB — BASIC METABOLIC PANEL
BUN: 20 mg/dL (ref 6–23)
CALCIUM: 9.6 mg/dL (ref 8.4–10.5)
CO2: 26 mEq/L (ref 19–32)
Chloride: 104 mEq/L (ref 96–112)
Creatinine, Ser: 0.79 mg/dL (ref 0.50–1.10)
GFR, EST AFRICAN AMERICAN: 87 mL/min — AB (ref >90)
GFR, EST NON AFRICAN AMERICAN: 75 mL/min — AB (ref >90)
GLUCOSE: 166 mg/dL — AB (ref 70–99)
POTASSIUM: 4.3 meq/L (ref 3.7–5.3)
SODIUM: 144 meq/L (ref 137–147)

## 2013-09-04 LAB — SURGICAL PCR SCREEN
MRSA, PCR: NEGATIVE
STAPHYLOCOCCUS AUREUS: NEGATIVE

## 2013-09-04 NOTE — Pre-Procedure Instructions (Signed)
Megan Page  09/04/2013   Your procedure is scheduled on:  Thursday, September 13, 2013 at 7:30 AM.   Report to [facility] Entrance "A" Admitting Office at 5:30 AM.   Call this number if you have problems the morning of surgery: [phone]   Remember:   Do not eat food or drink liquids after midnight Wednesday, 09/12/13.   Take these medicines the morning of surgery with A SIP OF WATER: acetaminophen (TYLENOL), amLODipine (NORVASC), anastrozole (ARIMIDEX), carvedilol (COREG), gabapentin (NEURONTIN), hydrALAZINE (APRESOLINE), fluticasone (FLONASE), HYDROcodone-acetaminophen (NORCO/VICODIN) - if needed.   Stop all Vitamins as of Thursday, 09/06/13.  Please bring brace with you day of surgery.    Do not wear jewelry, make-up or nail polish.  Do not wear lotions, powders, or perfumes. You may wear deodorant.  Do not shave 48 hours prior to surgery.   Do not bring valuables to the hospital.  [facility] is not responsible                  for any belongings or valuables.               Contacts, dentures or bridgework may not be worn into surgery.  Leave suitcase in the car. After surgery it may be brought to your room.  For patients admitted to the hospital, discharge time is determined by your                treatment team.              Special Instructions: [place] - Preparing for Surgery  Before surgery, you can play an important role.  Because skin is not sterile, your skin needs to be as free of germs as possible.  You can reduce the number of germs on you skin by washing with CHG (chlorahexidine gluconate) soap before surgery.  CHG is an antiseptic cleaner which kills germs and bonds with the skin to continue killing germs even after washing.  Please DO NOT use if you have an allergy to CHG or antibacterial soaps.  If your skin becomes reddened/irritated stop using the CHG and inform your nurse when you arrive at Short Stay.  Do not shave (including legs and underarms)  for at least 48 hours prior to the first CHG shower.  You may shave your face.  Please follow these instructions carefully:   1.  Shower with CHG Soap the night before surgery and the                                morning of Surgery.  2.  If you choose to wash your hair, wash your hair first as usual with your       normal shampoo.  3.  After you shampoo, rinse your hair and body thoroughly to remove the                      Shampoo.  4.  Use CHG as you would any other liquid soap.  You can apply chg directly       to the skin and wash gently with scrungie or a clean washcloth.  5.  Apply the CHG Soap to your body ONLY FROM THE NECK DOWN.        Do not use on open wounds or open sores.  Avoid contact with your eyes, ears, mouth and genitals (private parts).  Wash genitals (  private parts) with your normal soap.  6.  Wash thoroughly, paying special attention to the area where your surgery        will be performed.  7.  Thoroughly rinse your body with warm water from the neck down.  8.  DO NOT shower/wash with your normal soap after using and rinsing off       the CHG Soap.  9.  Pat yourself dry with a clean towel.            10.  Wear clean pajamas.            11.  Place clean sheets on your bed the night of your first shower and do not        sleep with pets.  Day of Surgery  Do not apply any lotions the morning of surgery.  Please wear clean clothes to the hospital/surgery center.     Please read over the following fact sheets that you were given: Pain Booklet, Coughing and Deep Breathing, MRSA Information and Surgical Site Infection Prevention

## 2013-09-04 NOTE — Progress Notes (Signed)
Pt states that she wants to go to [facility] for rehab. States she has told this to Dr. Rolena Infante.

## 2013-09-04 NOTE — Pre-Procedure Instructions (Signed)
Megan Page  09/04/2013   Your procedure is scheduled on:  Thursday, September 13, 2013 at 7:30 AM.   Report to [facility] Entrance "A" Admitting Office at 5:30 AM.   Call this number if you have problems the morning of surgery: [phone]   Remember:   Do not eat food or drink liquids after midnight Wednesday, 09/12/13.   Take these medicines the morning of surgery with A SIP OF WATER: acetaminophen (TYLENOL), amLODipine (NORVASC), anastrozole (ARIMIDEX), carvedilol (COREG), gabapentin (NEURONTIN), hydrALAZINE (APRESOLINE), fluticasone (FLONASE), HYDROcodone-acetaminophen (NORCO/VICODIN) - if needed.   Stop all Vitamins as of Thursday, 09/06/13.    Do not wear jewelry, make-up or nail polish.  Do not wear lotions, powders, or perfumes. You may wear deodorant.  Do not shave 48 hours prior to surgery.   Do not bring valuables to the hospital.  [facility] is not responsible                  for any belongings or valuables.               Contacts, dentures or bridgework may not be worn into surgery.  Leave suitcase in the car. After surgery it may be brought to your room.  For patients admitted to the hospital, discharge time is determined by your                treatment team.              Special Instructions: [place] - Preparing for Surgery  Before surgery, you can play an important role.  Because skin is not sterile, your skin needs to be as free of germs as possible.  You can reduce the number of germs on you skin by washing with CHG (chlorahexidine gluconate) soap before surgery.  CHG is an antiseptic cleaner which kills germs and bonds with the skin to continue killing germs even after washing.  Please DO NOT use if you have an allergy to CHG or antibacterial soaps.  If your skin becomes reddened/irritated stop using the CHG and inform your nurse when you arrive at Short Stay.  Do not shave (including legs and underarms) for at least 48 hours prior to the first CHG  shower.  You may shave your face.  Please follow these instructions carefully:   1.  Shower with CHG Soap the night before surgery and the                                morning of Surgery.  2.  If you choose to wash your hair, wash your hair first as usual with your       normal shampoo.  3.  After you shampoo, rinse your hair and body thoroughly to remove the                      Shampoo.  4.  Use CHG as you would any other liquid soap.  You can apply chg directly       to the skin and wash gently with scrungie or a clean washcloth.  5.  Apply the CHG Soap to your body ONLY FROM THE NECK DOWN.        Do not use on open wounds or open sores.  Avoid contact with your eyes, ears, mouth and genitals (private parts).  Wash genitals (private parts) with your normal soap.  6.  Wash thoroughly, paying special attention to the area where your surgery        will be performed.  7.  Thoroughly rinse your body with warm water from the neck down.  8.  DO NOT shower/wash with your normal soap after using and rinsing off       the CHG Soap.  9.  Pat yourself dry with a clean towel.            10.  Wear clean pajamas.            11.  Place clean sheets on your bed the night of your first shower and do not        sleep with pets.  Day of Surgery  Do not apply any lotions the morning of surgery.  Please wear clean clothes to the hospital/surgery center.     Please read over the following fact sheets that you were given: Pain Booklet, Coughing and Deep Breathing, MRSA Information and Surgical Site Infection Prevention

## 2013-09-10 NOTE — H&P (Signed)
Megan Page is an 80 y.o. female.   Chief Complaint:  Back pain HPI: patient presented to our office 04 September 2013 for preop evaluation.  Scheduled for L3-5 decompression, removal of x-stop, and L4 kyphoplasty.  Wants to proceed.  Symptoms unchanged from previous office visit.    Past Medical History  Diagnosis Date  . Hypertension   . Hypercholesteremia   . Anxiety     just for surgery  . Headache(784.0)   . Cancer     left breast cancer  . Arthritis     back, knees  . Ruptured lumbar disc   . History of kidney stones   . Thrush     hx of from antibiotics  . Yeast infection     hx of because of antibiotics  . Baker's cyst of knee     left knee  . Complication of anesthesia     wakes up really fast, sometimes close to end of surgery  . Restless leg syndrome     medicine controlled  . GERD (gastroesophageal reflux disease)     no longer taking meds for this  . History of blood transfusion     with knee replacement  . Spastic colon     Past Surgical History  Procedure Laterality Date  . Breast surgery Left 2012    lumpectomy  . Kidney stone surgery      removed with stent placement, infection after surgery  . Goiter removed  1972  . Appendectomy  at age 83  . Abdominal hysterectomy  1972  . Back surgery  2008    "XSTOP implanted in lower back"  . Tonsillectomy  1937  . Knee arthroscopy Left 5 or 6 years ago  . Cataract extraction w/ intraocular lens  implant, bilateral    . Total knee arthroplasty Left 10/27/2012    Procedure: TOTAL KNEE ARTHROPLASTY;  Surgeon: Tobi Bastos, MD;  Location: [facility];  Service: Orthopedics;  Laterality: Left;    Family History  Problem Relation Age of Onset  . Heart disease Mother   . Parkinson's disease Father    Social History:  reports that she quit smoking about 35 years ago. Her smoking use included Cigarettes. She smoked 0.25 packs per day. She has never used smokeless tobacco. She reports that she does not drink  alcohol or use illicit drugs.  Allergies:  Allergies  Allergen Reactions  . Celebrex [Celecoxib] Shortness Of Breath and Rash  . Macrolides And Ketolides Hives  . Sulfa Antibiotics Rash  . Aspirin Other (See Comments)    Affected platelets     No prescriptions prior to admission    No results found for this or any previous visit (from the past 48 hour(s)). No results found.  Review of Systems  Constitutional: Negative.   HENT: Negative.   Eyes: Negative.   Respiratory: Negative.   Cardiovascular: Negative.   Gastrointestinal: Negative.   Genitourinary: Negative.   Musculoskeletal: Positive for back pain.  Skin: Negative.   Neurological: Negative for seizures and loss of consciousness.  Psychiatric/Behavioral: Negative.     There were no vitals taken for this visit. Physical Exam  Constitutional: She is oriented to person, place, and time. She appears well-developed.  HENT:  Head: Normocephalic and atraumatic.  Eyes: EOM are normal. Pupils are equal, round, and reactive to light.  Neck: Normal range of motion.  Cardiovascular: Normal rate.   Respiratory: Effort normal and breath sounds normal.  GI: Soft. Bowel sounds are normal.  Musculoskeletal: She exhibits tenderness.  Neurological: She is alert and oriented to person, place, and time.  Skin: Skin is warm and dry.     Assessment/Plan Lumbar stenosis, L4 compression fracture and retained implant Will proceed with surgery as scheduled. Surgical procedure along with potential risks and complications discussed.  All questions answered.    Travarius Lange M 09/10/2013, 10:16 AM

## 2013-09-11 NOTE — H&P (Signed)
All questions addressed Plan on proceeding with surgery as planned Agree with above

## 2013-09-12 ENCOUNTER — Encounter (HOSPITAL_COMMUNITY): Payer: Self-pay | Admitting: Anesthesiology

## 2013-09-12 MED ORDER — DEXAMETHASONE SODIUM PHOSPHATE 4 MG/ML IJ SOLN
4.0000 mg | Freq: Once | INTRAMUSCULAR | Status: AC
  Administered 2013-09-13: 4 mg via INTRAVENOUS
  Filled 2013-09-12: qty 1

## 2013-09-12 MED ORDER — CEFAZOLIN SODIUM-DEXTROSE 2-3 GM-% IV SOLR
2.0000 g | INTRAVENOUS | Status: AC
  Administered 2013-09-13: 2 g via INTRAVENOUS
  Filled 2013-09-12: qty 50

## 2013-09-12 MED ORDER — ACETAMINOPHEN 10 MG/ML IV SOLN
1000.0000 mg | Freq: Four times a day (QID) | INTRAVENOUS | Status: DC
  Administered 2013-09-13: 1000 mg via INTRAVENOUS

## 2013-09-13 ENCOUNTER — Encounter (HOSPITAL_COMMUNITY): Payer: Self-pay | Admitting: *Deleted

## 2013-09-13 ENCOUNTER — Encounter (HOSPITAL_COMMUNITY): Payer: Medicare Other | Admitting: Anesthesiology

## 2013-09-13 ENCOUNTER — Encounter (HOSPITAL_COMMUNITY)
Admission: RE | Disposition: A | Discharge status: Skilled Nursing Facility | Payer: Self-pay | Source: Ambulatory Visit | Attending: Orthopedic Surgery

## 2013-09-13 ENCOUNTER — Inpatient Hospital Stay (HOSPITAL_COMMUNITY)
Admission: RE | Admit: 2013-09-13 | Discharge: 2013-09-17 | DRG: 519 | Disposition: A | Discharge status: Skilled Nursing Facility | Payer: Medicare Other | Source: Ambulatory Visit | Attending: Orthopedic Surgery | Admitting: Orthopedic Surgery

## 2013-09-13 ENCOUNTER — Inpatient Hospital Stay (HOSPITAL_COMMUNITY): Payer: Medicare Other | Admitting: Anesthesiology

## 2013-09-13 ENCOUNTER — Inpatient Hospital Stay (HOSPITAL_COMMUNITY): Payer: Medicare Other

## 2013-09-13 DIAGNOSIS — G2581 Restless legs syndrome: Secondary | ICD-10-CM | POA: Diagnosis not present

## 2013-09-13 DIAGNOSIS — M48061 Spinal stenosis, lumbar region without neurogenic claudication: Principal | ICD-10-CM | POA: Diagnosis present

## 2013-09-13 DIAGNOSIS — X58XXXA Exposure to other specified factors, initial encounter: Secondary | ICD-10-CM | POA: Diagnosis not present

## 2013-09-13 DIAGNOSIS — S51809A Unspecified open wound of unspecified forearm, initial encounter: Secondary | ICD-10-CM | POA: Diagnosis not present

## 2013-09-13 DIAGNOSIS — Z9849 Cataract extraction status, unspecified eye: Secondary | ICD-10-CM | POA: Diagnosis not present

## 2013-09-13 DIAGNOSIS — Z96659 Presence of unspecified artificial knee joint: Secondary | ICD-10-CM

## 2013-09-13 DIAGNOSIS — Z8249 Family history of ischemic heart disease and other diseases of the circulatory system: Secondary | ICD-10-CM | POA: Diagnosis not present

## 2013-09-13 DIAGNOSIS — F411 Generalized anxiety disorder: Secondary | ICD-10-CM | POA: Diagnosis not present

## 2013-09-13 DIAGNOSIS — S32009A Unspecified fracture of unspecified lumbar vertebra, initial encounter for closed fracture: Secondary | ICD-10-CM | POA: Diagnosis present

## 2013-09-13 DIAGNOSIS — Z87891 Personal history of nicotine dependence: Secondary | ICD-10-CM | POA: Diagnosis not present

## 2013-09-13 DIAGNOSIS — Z4789 Encounter for other orthopedic aftercare: Secondary | ICD-10-CM | POA: Diagnosis not present

## 2013-09-13 DIAGNOSIS — M549 Dorsalgia, unspecified: Secondary | ICD-10-CM | POA: Diagnosis present

## 2013-09-13 DIAGNOSIS — M519 Unspecified thoracic, thoracolumbar and lumbosacral intervertebral disc disorder: Secondary | ICD-10-CM | POA: Diagnosis not present

## 2013-09-13 DIAGNOSIS — M1710 Unilateral primary osteoarthritis, unspecified knee: Secondary | ICD-10-CM | POA: Diagnosis not present

## 2013-09-13 DIAGNOSIS — Z961 Presence of intraocular lens: Secondary | ICD-10-CM | POA: Diagnosis not present

## 2013-09-13 DIAGNOSIS — K219 Gastro-esophageal reflux disease without esophagitis: Secondary | ICD-10-CM | POA: Diagnosis not present

## 2013-09-13 DIAGNOSIS — K589 Irritable bowel syndrome without diarrhea: Secondary | ICD-10-CM | POA: Diagnosis present

## 2013-09-13 DIAGNOSIS — M7120 Synovial cyst of popliteal space [Baker], unspecified knee: Secondary | ICD-10-CM | POA: Diagnosis not present

## 2013-09-13 DIAGNOSIS — E7800 Pure hypercholesterolemia, unspecified: Secondary | ICD-10-CM | POA: Diagnosis present

## 2013-09-13 DIAGNOSIS — IMO0002 Reserved for concepts with insufficient information to code with codable children: Secondary | ICD-10-CM | POA: Diagnosis not present

## 2013-09-13 DIAGNOSIS — M129 Arthropathy, unspecified: Secondary | ICD-10-CM | POA: Diagnosis not present

## 2013-09-13 DIAGNOSIS — I10 Essential (primary) hypertension: Secondary | ICD-10-CM | POA: Diagnosis present

## 2013-09-13 DIAGNOSIS — Z853 Personal history of malignant neoplasm of breast: Secondary | ICD-10-CM | POA: Diagnosis not present

## 2013-09-13 DIAGNOSIS — R51 Headache: Secondary | ICD-10-CM | POA: Diagnosis not present

## 2013-09-13 DIAGNOSIS — Y9210 Unspecified residential institution as the place of occurrence of the external cause: Secondary | ICD-10-CM | POA: Diagnosis not present

## 2013-09-13 DIAGNOSIS — M1990 Unspecified osteoarthritis, unspecified site: Secondary | ICD-10-CM | POA: Diagnosis not present

## 2013-09-13 DIAGNOSIS — Z5189 Encounter for other specified aftercare: Secondary | ICD-10-CM | POA: Diagnosis not present

## 2013-09-13 HISTORY — PX: LUMBAR LAMINECTOMY/DECOMPRESSION MICRODISCECTOMY: SHX5026

## 2013-09-13 SURGERY — LUMBAR LAMINECTOMY/DECOMPRESSION MICRODISCECTOMY 2 LEVELS
Anesthesia: General | Site: Back

## 2013-09-13 MED ORDER — SODIUM CHLORIDE 0.9 % IV SOLN: 250.0000 mL | INTRAVENOUS | Status: DC

## 2013-09-13 MED ORDER — MORPHINE SULFATE 2 MG/ML IJ SOLN
1.0000 mg | INTRAMUSCULAR | Status: DC | PRN
  Administered 2013-09-13 (×2): 2 mg via INTRAVENOUS
  Filled 2013-09-13 (×2): qty 1

## 2013-09-13 MED ORDER — BUPIVACAINE-EPINEPHRINE (PF) 0.25% -1:200000 IJ SOLN
INTRAMUSCULAR | Status: DC | PRN
  Administered 2013-09-13: 10 mL

## 2013-09-13 MED ORDER — FENTANYL CITRATE 0.05 MG/ML IJ SOLN
INTRAMUSCULAR | Status: DC | PRN
  Administered 2013-09-13: 50 ug via INTRAVENOUS
  Administered 2013-09-13: 100 ug via INTRAVENOUS

## 2013-09-13 MED ORDER — FENTANYL CITRATE 0.05 MG/ML IJ SOLN
25.0000 ug | INTRAMUSCULAR | Status: DC | PRN
  Administered 2013-09-13 (×2): 50 ug via INTRAVENOUS

## 2013-09-13 MED ORDER — ANASTROZOLE 1 MG PO TABS
1.0000 mg | ORAL_TABLET | Freq: Every day | ORAL | Status: DC
  Administered 2013-09-14 – 2013-09-17 (×4): 1 mg via ORAL
  Filled 2013-09-13 (×4): qty 1

## 2013-09-13 MED ORDER — ROCURONIUM BROMIDE 100 MG/10ML IV SOLN
INTRAVENOUS | Status: DC | PRN
  Administered 2013-09-13 (×2): 10 mg via INTRAVENOUS
  Administered 2013-09-13: 40 mg via INTRAVENOUS

## 2013-09-13 MED ORDER — PHENOL 1.4 % MT LIQD: 1.0000 | OROMUCOSAL | Status: DC | PRN

## 2013-09-13 MED ORDER — THROMBIN 5000 UNITS EX SOLR
CUTANEOUS | Status: AC
  Filled 2013-09-13: qty 5000

## 2013-09-13 MED ORDER — PHENYLEPHRINE HCL 10 MG/ML IJ SOLN
10.0000 mg | INTRAVENOUS | Status: DC | PRN
  Administered 2013-09-13: 15 ug/min via INTRAVENOUS

## 2013-09-13 MED ORDER — DEXAMETHASONE 4 MG PO TABS
4.0000 mg | ORAL_TABLET | Freq: Four times a day (QID) | ORAL | Status: DC
  Administered 2013-09-13 – 2013-09-17 (×15): 4 mg via ORAL
  Filled 2013-09-13 (×19): qty 1

## 2013-09-13 MED ORDER — OXYCODONE HCL 5 MG PO TABS
10.0000 mg | ORAL_TABLET | ORAL | Status: DC | PRN
  Administered 2013-09-13 – 2013-09-14 (×4): 10 mg via ORAL
  Filled 2013-09-13 (×4): qty 2

## 2013-09-13 MED ORDER — NEOSTIGMINE METHYLSULFATE 10 MG/10ML IV SOLN
INTRAVENOUS | Status: DC | PRN
  Administered 2013-09-13: 4 mg via INTRAVENOUS

## 2013-09-13 MED ORDER — BACITRACIN-NEOMYCIN-POLYMYXIN 400-5-5000 EX OINT
TOPICAL_OINTMENT | CUTANEOUS | Status: AC
  Filled 2013-09-13: qty 1

## 2013-09-13 MED ORDER — POLYVINYL ALCOHOL 1.4 % OP SOLN
1.0000 [drp] | OPHTHALMIC | Status: DC | PRN
  Filled 2013-09-13 (×2): qty 15

## 2013-09-13 MED ORDER — FENTANYL CITRATE 0.05 MG/ML IJ SOLN
INTRAMUSCULAR | Status: AC
  Filled 2013-09-13: qty 5

## 2013-09-13 MED ORDER — GELATIN ABSORBABLE MT POWD
OROMUCOSAL | Status: DC | PRN
  Administered 2013-09-13: 08:00:00 via TOPICAL

## 2013-09-13 MED ORDER — HYDRALAZINE HCL 50 MG PO TABS
50.0000 mg | ORAL_TABLET | Freq: Four times a day (QID) | ORAL | Status: DC
  Administered 2013-09-13 – 2013-09-17 (×15): 50 mg via ORAL
  Filled 2013-09-13 (×18): qty 1

## 2013-09-13 MED ORDER — TIZANIDINE HCL 4 MG PO TABS
4.0000 mg | ORAL_TABLET | Freq: Four times a day (QID) | ORAL | Status: DC | PRN
  Administered 2013-09-13: 4 mg via ORAL
  Filled 2013-09-13 (×3): qty 1

## 2013-09-13 MED ORDER — SODIUM CHLORIDE 0.9 % IJ SOLN
3.0000 mL | Freq: Two times a day (BID) | INTRAMUSCULAR | Status: DC
  Administered 2013-09-14 – 2013-09-16 (×3): 3 mL via INTRAVENOUS

## 2013-09-13 MED ORDER — MENTHOL 3 MG MT LOZG
1.0000 | LOZENGE | OROMUCOSAL | Status: DC | PRN
  Administered 2013-09-14: 3 mg via ORAL
  Filled 2013-09-13: qty 9

## 2013-09-13 MED ORDER — HEMOSTATIC AGENTS (NO CHARGE) OPTIME
TOPICAL | Status: DC | PRN
  Administered 2013-09-13: 1 via TOPICAL

## 2013-09-13 MED ORDER — FENTANYL CITRATE 0.05 MG/ML IJ SOLN
INTRAMUSCULAR | Status: AC
  Filled 2013-09-13: qty 2

## 2013-09-13 MED ORDER — GLYCOPYRROLATE 0.2 MG/ML IJ SOLN
INTRAMUSCULAR | Status: DC | PRN
  Administered 2013-09-13: 0.2 mg via INTRAVENOUS
  Administered 2013-09-13: .7 mg via INTRAVENOUS

## 2013-09-13 MED ORDER — ONDANSETRON HCL 4 MG/2ML IJ SOLN
4.0000 mg | INTRAMUSCULAR | Status: DC | PRN
  Administered 2013-09-15: 4 mg via INTRAVENOUS
  Filled 2013-09-13: qty 2

## 2013-09-13 MED ORDER — IOHEXOL 300 MG/ML  SOLN
INTRAMUSCULAR | Status: DC | PRN
  Administered 2013-09-13: 50 mL

## 2013-09-13 MED ORDER — EPHEDRINE SULFATE 50 MG/ML IJ SOLN
INTRAMUSCULAR | Status: DC | PRN
  Administered 2013-09-13 (×2): 10 mg via INTRAVENOUS

## 2013-09-13 MED ORDER — DEXAMETHASONE SODIUM PHOSPHATE 4 MG/ML IJ SOLN
4.0000 mg | Freq: Four times a day (QID) | INTRAMUSCULAR | Status: DC
  Filled 2013-09-13 (×19): qty 1

## 2013-09-13 MED ORDER — ONDANSETRON HCL 4 MG/2ML IJ SOLN: 4.0000 mg | Freq: Once | INTRAMUSCULAR | Status: DC | PRN

## 2013-09-13 MED ORDER — LACTATED RINGERS IV SOLN
INTRAVENOUS | Status: DC
  Administered 2013-09-13 – 2013-09-14 (×2): via INTRAVENOUS

## 2013-09-13 MED ORDER — CEFAZOLIN SODIUM 1-5 GM-% IV SOLN
1.0000 g | Freq: Three times a day (TID) | INTRAVENOUS | Status: AC
  Administered 2013-09-13 (×2): 1 g via INTRAVENOUS
  Filled 2013-09-13 (×2): qty 50

## 2013-09-13 MED ORDER — ACETAMINOPHEN 10 MG/ML IV SOLN
1000.0000 mg | Freq: Four times a day (QID) | INTRAVENOUS | Status: AC
  Administered 2013-09-13 – 2013-09-14 (×4): 1000 mg via INTRAVENOUS
  Filled 2013-09-13 (×5): qty 100

## 2013-09-13 MED ORDER — LIDOCAINE HCL (CARDIAC) 20 MG/ML IV SOLN
INTRAVENOUS | Status: DC | PRN
  Administered 2013-09-13: 75 mg via INTRAVENOUS

## 2013-09-13 MED ORDER — PROPOFOL 10 MG/ML IV BOLUS
INTRAVENOUS | Status: AC
  Filled 2013-09-13: qty 20

## 2013-09-13 MED ORDER — DEXTROSE 5 % IV SOLN
500.0000 mg | Freq: Four times a day (QID) | INTRAVENOUS | Status: DC | PRN
  Filled 2013-09-13: qty 5

## 2013-09-13 MED ORDER — METHOCARBAMOL 500 MG PO TABS
ORAL_TABLET | ORAL | Status: AC
  Filled 2013-09-13: qty 1

## 2013-09-13 MED ORDER — METHOCARBAMOL 500 MG PO TABS
500.0000 mg | ORAL_TABLET | Freq: Four times a day (QID) | ORAL | Status: DC | PRN
  Administered 2013-09-13: 500 mg via ORAL
  Filled 2013-09-13: qty 1

## 2013-09-13 MED ORDER — SODIUM CHLORIDE 0.9 % IJ SOLN: 3.0000 mL | INTRAMUSCULAR | Status: DC | PRN

## 2013-09-13 MED ORDER — BUPIVACAINE-EPINEPHRINE (PF) 0.25% -1:200000 IJ SOLN
INTRAMUSCULAR | Status: AC
  Filled 2013-09-13: qty 30

## 2013-09-13 MED ORDER — ONDANSETRON HCL 4 MG/2ML IJ SOLN
INTRAMUSCULAR | Status: DC | PRN
  Administered 2013-09-13: 4 mg via INTRAVENOUS

## 2013-09-13 MED ORDER — ZOLPIDEM TARTRATE 5 MG PO TABS
5.0000 mg | ORAL_TABLET | Freq: Every evening | ORAL | Status: DC | PRN
  Administered 2013-09-13: 5 mg via ORAL
  Filled 2013-09-13: qty 1

## 2013-09-13 MED ORDER — LOSARTAN POTASSIUM 50 MG PO TABS
100.0000 mg | ORAL_TABLET | Freq: Every day | ORAL | Status: DC
  Administered 2013-09-14 – 2013-09-17 (×4): 100 mg via ORAL
  Filled 2013-09-13 (×4): qty 2

## 2013-09-13 MED ORDER — AMLODIPINE BESYLATE 10 MG PO TABS
10.0000 mg | ORAL_TABLET | Freq: Every day | ORAL | Status: DC
  Administered 2013-09-14 – 2013-09-17 (×4): 10 mg via ORAL
  Filled 2013-09-13 (×4): qty 1

## 2013-09-13 MED ORDER — PROPOFOL 10 MG/ML IV BOLUS
INTRAVENOUS | Status: DC | PRN
  Administered 2013-09-13: 130 mg via INTRAVENOUS

## 2013-09-13 MED ORDER — LACTATED RINGERS IV SOLN
INTRAVENOUS | Status: DC | PRN
  Administered 2013-09-13 (×2): via INTRAVENOUS

## 2013-09-13 SURGICAL SUPPLY — 63 items
#3 BONE FILLER DEVICE ×6 IMPLANT
BANDAGE GAUZE ELAST BULKY 4 IN (GAUZE/BANDAGES/DRESSINGS) ×3 IMPLANT
BONE FILLER DEVICE STRL SZ3 (INSTRUMENTS) ×3 IMPLANT
BUR EGG ELITE 4.0 (BURR) IMPLANT
BUR EGG ELITE 4.0MM (BURR)
CEMENT BONE KYPHX HV R (Orthopedic Implant) ×6 IMPLANT
CEMENT KYPHON C01A KIT/MIXER (Cement) ×6 IMPLANT
CLOSURE STERI-STRIP 1/2X4 (GAUZE/BANDAGES/DRESSINGS) ×1
CLOSURE WOUND 1/2 X4 (GAUZE/BANDAGES/DRESSINGS)
CLSR STERI-STRIP ANTIMIC 1/2X4 (GAUZE/BANDAGES/DRESSINGS) ×2 IMPLANT
CORDS BIPOLAR (ELECTRODE) ×3 IMPLANT
DRAPE C-ARM 42X72 X-RAY (DRAPES) ×3 IMPLANT
DRAPE POUCH INSTRU U-SHP 10X18 (DRAPES) ×3 IMPLANT
DRAPE SURG 17X11 SM STRL (DRAPES) ×3 IMPLANT
DRAPE U-SHAPE 47X51 STRL (DRAPES) ×3 IMPLANT
DRSG MEPILEX BORDER 4X4 (GAUZE/BANDAGES/DRESSINGS) ×3 IMPLANT
DRSG MEPILEX BORDER 4X8 (GAUZE/BANDAGES/DRESSINGS) ×3 IMPLANT
DURAPREP 26ML APPLICATOR (WOUND CARE) ×3 IMPLANT
ELECT BLADE 4.0 EZ CLEAN MEGAD (MISCELLANEOUS) ×3
ELECT CAUTERY BLADE 6.4 (BLADE) ×3 IMPLANT
ELECT PENCIL ROCKER SW 15FT (MISCELLANEOUS) ×3 IMPLANT
ELECT REM PT RETURN 9FT ADLT (ELECTROSURGICAL) ×3
ELECTRODE BLDE 4.0 EZ CLN MEGD (MISCELLANEOUS) ×1 IMPLANT
ELECTRODE REM PT RTRN 9FT ADLT (ELECTROSURGICAL) ×1 IMPLANT
FLOSEAL (HEMOSTASIS) ×3 IMPLANT
GLOVE BIOGEL PI IND STRL 8 (GLOVE) ×1 IMPLANT
GLOVE BIOGEL PI IND STRL 8.5 (GLOVE) ×1 IMPLANT
GLOVE BIOGEL PI INDICATOR 8 (GLOVE) ×2
GLOVE BIOGEL PI INDICATOR 8.5 (GLOVE) ×2
GLOVE ECLIPSE 8.5 STRL (GLOVE) ×3 IMPLANT
GLOVE ORTHO TXT STRL SZ7.5 (GLOVE) ×3 IMPLANT
GOWN STRL REUS W/ TWL LRG LVL3 (GOWN DISPOSABLE) ×1 IMPLANT
GOWN STRL REUS W/TWL 2XL LVL3 (GOWN DISPOSABLE) ×6 IMPLANT
GOWN STRL REUS W/TWL LRG LVL3 (GOWN DISPOSABLE) ×2
KIT BASIN OR (CUSTOM PROCEDURE TRAY) ×3 IMPLANT
NEEDLE 22X1 1/2 (OR ONLY) (NEEDLE) ×3 IMPLANT
NEEDLE SPNL 18GX3.5 QUINCKE PK (NEEDLE) ×6 IMPLANT
NS IRRIG 1000ML POUR BTL (IV SOLUTION) ×3 IMPLANT
PACK LAMINECTOMY ORTHO (CUSTOM PROCEDURE TRAY) ×3 IMPLANT
PACK UNIVERSAL I (CUSTOM PROCEDURE TRAY) ×3 IMPLANT
PATTIES SURGICAL .5 X.5 (GAUZE/BANDAGES/DRESSINGS) IMPLANT
PATTIES SURGICAL .5 X1 (DISPOSABLE) ×3 IMPLANT
SPONGE LAP 4X18 X RAY DECT (DISPOSABLE) IMPLANT
SPONGE SURGIFOAM ABS GEL 100 (HEMOSTASIS) ×3 IMPLANT
STAPLER VISISTAT 35W (STAPLE) IMPLANT
STRIP CLOSURE SKIN 1/2X4 (GAUZE/BANDAGES/DRESSINGS) IMPLANT
SURGIFLO TRUKIT (HEMOSTASIS) IMPLANT
SUT BONE WAX W31G (SUTURE) ×3 IMPLANT
SUT MON AB 3-0 SH 27 (SUTURE) ×2
SUT MON AB 3-0 SH27 (SUTURE) ×1 IMPLANT
SUT VIC AB 1 CT1 18XCR BRD 8 (SUTURE) ×1 IMPLANT
SUT VIC AB 1 CT1 27 (SUTURE) ×4
SUT VIC AB 1 CT1 27XBRD ANTBC (SUTURE) ×2 IMPLANT
SUT VIC AB 1 CT1 8-18 (SUTURE) ×2
SUT VIC AB 2-0 CT1 18 (SUTURE) ×6 IMPLANT
SUT VICRYL 0 UR6 27IN ABS (SUTURE) ×3 IMPLANT
SYR BULB IRRIGATION 50ML (SYRINGE) ×3 IMPLANT
SYR CONTROL 10ML LL (SYRINGE) ×3 IMPLANT
TOWEL OR 17X26 10 PK STRL BLUE (TOWEL DISPOSABLE) ×6 IMPLANT
TRAY FOLEY CATH 16FRSI W/METER (SET/KITS/TRAYS/PACK) ×3 IMPLANT
TRAY KYPHOPAK 15/3 ONESTEP 1ST (MISCELLANEOUS) ×3 IMPLANT
WATER STERILE IRR 1000ML POUR (IV SOLUTION) IMPLANT
YANKAUER SUCT BULB TIP NO VENT (SUCTIONS) ×3 IMPLANT

## 2013-09-13 NOTE — Progress Notes (Signed)
Orthopedic Tech Progress Note Patient Details:  Megan Page August 07, 1928 [id number]  Patient ID: [name], female   DOB: 08-02-1928, 80 y.o.   MRN: [id number] rn stated that pt has lumbar corsett in room   [place], [place] 09/13/2013, 8:22 PM

## 2013-09-13 NOTE — H&P (Signed)
No change to clinical exam H+P reviewed

## 2013-09-13 NOTE — Transfer of Care (Signed)
Immediate Anesthesia Transfer of Care Note  Patient: Megan Page  Procedure(s) Performed: Procedure(s): L4 KYPHOPLASTY L3=S1 DECOMPRESSION AND REMOVAL OF X-STOP (N/A)  Patient Location: PACU  Anesthesia Type:General  Level of Consciousness: awake, alert  and oriented  Airway & Oxygen Therapy: Patient Spontanous Breathing and Patient connected to nasal cannula oxygen  Post-op Assessment: Report given to PACU RN and Post -op Vital signs reviewed and stable  Post vital signs: Reviewed and stable  Complications: No apparent anesthesia complications

## 2013-09-13 NOTE — Progress Notes (Signed)
Patient states that she prefers to take home medication for muscle spasms and she also requested Ambien to help her sleep.  Patient does not want to continue to take Robaxin.  Patient also complains of eye discomfort following surgery.  On call Jerilynn Mages contacted.  Orders received, see MAR.

## 2013-09-13 NOTE — Anesthesia Postprocedure Evaluation (Signed)
Anesthesia Post Note  Patient: Megan Page  Procedure(s) Performed: Procedure(s) (LRB): L4 KYPHOPLASTY L3=S1 DECOMPRESSION AND REMOVAL OF X-STOP (N/A)  Anesthesia type: General  Patient location: PACU  Post pain: Pain level controlled and Adequate analgesia  Post assessment: Post-op Vital signs reviewed, Patient's Cardiovascular Status Stable, Respiratory Function Stable, Patent Airway and Pain level controlled  Last Vitals:  Filed Vitals:   09/13/13 1245  BP: 107/49  Pulse: 57  Temp:   Resp: 12    Post vital signs: Reviewed and stable  Level of consciousness: awake, alert  and oriented  Complications: No apparent anesthesia complications

## 2013-09-13 NOTE — OR Nursing (Signed)
Implant removed and sent to [facility] to be sterilized and given to patient.

## 2013-09-13 NOTE — Progress Notes (Signed)
Pt received from PACU. Per PACU RN, pt sustained skin tears to bil forearms while turning in PACU. Pt has opsites to both forearm skin tear sites. Pt, per her admission, has very "thin skin". Pt has bruising of bilateral upper extremities.

## 2013-09-13 NOTE — Anesthesia Preprocedure Evaluation (Signed)
Anesthesia Evaluation  Patient identified by MRN, date of birth, ID band Patient awake    Reviewed: Allergy & Precautions, H&P , NPO status , Patient's Chart, lab work & pertinent test results  Airway Mallampati: II  Neck ROM: full    Dental   Pulmonary former smoker,          Cardiovascular hypertension,     Neuro/Psych  Headaches, Anxiety    GI/Hepatic GERD-  ,  Endo/Other  Morbid obesity  Renal/GU      Musculoskeletal  (+) Arthritis -,   Abdominal   Peds  Hematology   Anesthesia Other Findings   Reproductive/Obstetrics H/o breast CA                           Anesthesia Physical Anesthesia Plan  ASA: II  Anesthesia Plan: General   Post-op Pain Management:    Induction: Intravenous  Airway Management Planned: Oral ETT  Additional Equipment:   Intra-op Plan:   Post-operative Plan: Extubation in OR  Informed Consent: I have reviewed the patients History and Physical, chart, labs and discussed the procedure including the risks, benefits and alternatives for the proposed anesthesia with the patient or authorized representative who has indicated his/her understanding and acceptance.     Plan Discussed with: CRNA, Anesthesiologist and Surgeon  Anesthesia Plan Comments:         Anesthesia Quick Evaluation

## 2013-09-13 NOTE — Anesthesia Procedure Notes (Signed)
Procedure Name: Intubation Date/Time: 09/13/2013 7:43 AM Performed by: Eligha Bridegroom Pre-anesthesia Checklist: Patient identified, Timeout performed, Emergency Drugs available, Suction available and Patient being monitored Patient Re-evaluated:Patient Re-evaluated prior to inductionOxygen Delivery Method: Circle system utilized Preoxygenation: Pre-oxygenation with 100% oxygen Intubation Type: IV induction Ventilation: Mask ventilation without difficulty Laryngoscope Size: Mac and 4 Grade View: Grade II Tube type: Oral Tube size: 7.0 mm Number of attempts: 1 Airway Equipment and Method: Stylet Placement Confirmation: ETT inserted through vocal cords under direct vision,  breath sounds checked- equal and bilateral and positive ETCO2 Secured at: 22 cm Tube secured with: Tape Dental Injury: Teeth and Oropharynx as per pre-operative assessment

## 2013-09-13 NOTE — Brief Op Note (Signed)
09/13/2013  11:14 AM  PATIENT:  Megan Page  80 y.o. female  PRE-OPERATIVE DIAGNOSIS:  RECURRENT STENOSIS L4 COMPRESSION FRACTURE  POST-OPERATIVE DIAGNOSIS:  RECURRENT STENOSIS L4 COMPRESSION FRACTURE  PROCEDURE:  Procedure(s): L4 KYPHOPLASTY L3=S1 DECOMPRESSION AND REMOVAL OF X-STOP (N/A)  SURGEON:  Surgeon(s) and Role:    * Melina Schools, MD - Primary  PHYSICIAN ASSISTANT:   ASSISTANTS: james owens   ANESTHESIA:   general  EBL:  Total I/O In: 1000 [I.V.:1000] Out: 500 [Urine:300; Blood:200]  BLOOD ADMINISTERED:none  DRAINS: none   LOCAL MEDICATIONS USED:  MARCAINE     SPECIMEN:  No Specimen  DISPOSITION OF SPECIMEN:  N/A  COUNTS:  YES  TOURNIQUET:  * No tourniquets in log *  DICTATION: .Other Dictation: Dictation Number [id number]  PLAN OF CARE: Admit to inpatient   PATIENT DISPOSITION:  PACU - hemodynamically stable.

## 2013-09-13 NOTE — Progress Notes (Signed)
Utilization review completed.  

## 2013-09-14 MED ORDER — MAGNESIUM HYDROXIDE 400 MG/5ML PO SUSP
30.0000 mL | Freq: Every day | ORAL | Status: DC
  Administered 2013-09-14 – 2013-09-17 (×4): 30 mL via ORAL
  Filled 2013-09-14 (×5): qty 30

## 2013-09-14 MED ORDER — WHITE PETROLATUM GEL
Status: AC
  Administered 2013-09-14: 0.2
  Filled 2013-09-14: qty 5

## 2013-09-14 MED ORDER — TIZANIDINE HCL 4 MG PO TABS: 4.0000 mg | ORAL_TABLET | Freq: Three times a day (TID) | ORAL | Status: AC | PRN

## 2013-09-14 MED ORDER — ZOLPIDEM TARTRATE 5 MG PO TABS
10.0000 mg | ORAL_TABLET | Freq: Every day | ORAL | Status: DC
  Administered 2013-09-15 – 2013-09-16 (×3): 10 mg via ORAL
  Filled 2013-09-14 (×3): qty 2

## 2013-09-14 MED ORDER — OXYCODONE-ACETAMINOPHEN 7.5-325 MG PO TABS: 1.0000 | ORAL_TABLET | Freq: Four times a day (QID) | ORAL | Status: AC | PRN

## 2013-09-14 MED ORDER — POLYETHYLENE GLYCOL 3350 17 G PO PACK: 17.0000 g | PACK | Freq: Every day | ORAL | Status: AC

## 2013-09-14 MED ORDER — ONDANSETRON 4 MG PO TBDP: 4.0000 mg | ORAL_TABLET | Freq: Three times a day (TID) | ORAL | Status: AC | PRN

## 2013-09-14 MED ORDER — DOCUSATE SODIUM 100 MG PO CAPS: 100.0000 mg | ORAL_CAPSULE | Freq: Two times a day (BID) | ORAL | Status: AC

## 2013-09-14 NOTE — Progress Notes (Signed)
Clinical Social Work Department BRIEF PSYCHOSOCIAL ASSESSMENT 09/14/2013  Patient:  Megan Page, Megan Page     Account Number:  [id number]     Admit date:  09/13/2013  Clinical Social Worker:  Megan Salon  Date/Time:  09/14/2013 09:13 AM  Referred by:  Care Management  Date Referred:  09/14/2013 Referred for  SNF Placement   Other Referral:   Interview type:  Patient Other interview type:    PSYCHOSOCIAL DATA Living Status:  ALONE Admitted from facility:   Level of care:   Primary support name:  Marya Fossa Primary support relationship to patient:  CHILD, ADULT Degree of support available:   Good    CURRENT CONCERNS Current Concerns  Post-Acute Placement   Other Concerns:    SOCIAL WORK ASSESSMENT / PLAN Clinical Social Worker received referral for SNF placement at d/c. Clinical Social Worker met with patient at bedside to offer support and discuss patient needs at discharge.  CSW introduced self and explained reason for visit. CSW explained SNF process to patient. Patient reported she is agreeable for SNF placement and prefers [name]. CSW encouraged patient to think about additional SNF options pending availability of preferred facility. Patient states her second choice would be [name]. CSW will complete FL2 for MD's signature and will update patient and family when bed offers are received.    CSW remains available for support and to facilitate patient discharge needs once medically ready.   Assessment/plan status:  Psychosocial Support/Ongoing Assessment of Needs Other assessment/ plan:   Information/referral to community resources:   CSW information    PATIENT'S/FAMILY'S RESPONSE TO PLAN OF CARE: Patient states she would like to go to [name].        Jeanette Caprice, MSW, [place]

## 2013-09-14 NOTE — Progress Notes (Addendum)
Clinical Social Work Department CLINICAL SOCIAL WORK PLACEMENT NOTE 09/14/2013  Patient:  SUZZANNE, BRUNKHORST  Account Number:  [id number] Admit date:  09/13/2013  Clinical Social Worker:  Megan Salon  Date/time:  09/14/2013 08:27 AM  Clinical Social Work is seeking post-discharge placement for this patient at the following level of care:   [place]   (*CSW will update this form in Epic as items are completed)   09/14/2013  Patient/family provided with [place] Department of Clinical Social Work's list of facilities offering this level of care within the geographic area requested by the patient (or if unable, by the patient's family).  09/14/2013  Patient/family informed of their freedom to choose among providers that offer the needed level of care, that participate in Medicare, Medicaid or managed care program needed by the patient, have an available bed and are willing to accept the patient.  09/14/2013  Patient/family informed of [facility]' ownership interest in [facility], as well as of the fact that they are under no obligation to receive care at this facility.  PASARR submitted to EDS on 09/14/2013 PASARR number received on 09/14/2013  FL2 transmitted to all facilities in geographic area requested by pt/family on  09/14/2013 FL2 transmitted to all facilities within larger geographic area on   Patient informed that his/her managed care company has contracts with or will negotiate with  certain facilities, including the following:     Patient/family informed of bed offers received:  09/17/2013 Patient chooses bed at [facility] Physician recommends and patient chooses bed at    [facility] on  09/17/2013 Patient to be transferred to facility by [facility] Patient and family notified of transfer on -- Pt declined for CSW to notify family Name of family member notified:    The following physician request were entered in Epic:   Additional  Comments:  Jeanette Caprice, MSW, [place]

## 2013-09-14 NOTE — Op Note (Signed)
NAMEMarland Page  MILO, SOLANA NO.:  [id number]  MEDICAL RECORD NO.:  [id number]  LOCATION:  5N07C                        FACILITY:  [facility]  PHYSICIAN:  Dahlia Bailiff, MD    DATE OF BIRTH:  [birth year]  DATE OF PROCEDURE:  09/13/2013 DATE OF DISCHARGE:  09/13/2013                              OPERATIVE REPORT   PREOPERATIVE DIAGNOSES:  Recurrent spinal stenosis, L4-5 and L3-4 status post X-STOP procedure in the past.  L4 compression fracture.  POSTOP DIAGNOSES:  Recurrent spinal stenosis, L4-5 and L3-4 status post X-STOP procedure in the past.  L4 compression fracture.  SURGEON:  Dahlia Bailiff, MD.  FIRST ASSISTANT:  Alyson Locket. Velora Heckler.  OPERATIVE PROCEDURE:  L3-S1 open decompression with removal of X-STOP device and L4 kyphoplasty.  COMPLICATIONS:  None.  CONDITION:  Stable.  The implant was removed and returned to the family.  It should be noted that initially the was plan was to do L3-5 decompression, however because of significant scar tissue from the previous surgery, I went down and removed the entire L5 spinous process and decompressed this level in order find original plains to freely and safely remove the X-STOP and decompressed the L4-5 level.  HISTORY:  This is a very pleasant woman who has been complaining of severe back pain bilateral for some time now.  Attempts at conservative management failed to alleviate her symptoms.  After discussing treatment options, she elected to proceed with surgery.  All appropriate risks, benefits, and alternatives were discussed with the patient.  Consent was obtained.  OPERATIVE NOTE:  The patient was brought to the operating room, placed supine on the operating table.  After successful induction of general anesthesia and endotracheal intubation, TEDs, SCDs, and Foley were inserted.  The patient was turned prone onto the Wilson frame and all bony prominences were well padded, and the back was prepped and  draped in a standard fashion.  Time-out was taken to confirm patient, procedure, and all other pertinent important data.  Once this was done, the previous lumbar incision was re-incised and extended in both directions.  Sharp dissection was carried out down to the deep fascia. I mobilized the deep fascia and Bovie to expose the L3 and L5 spinous processes, lateral lamina and the medial aspect of the facet complex. Once I had this done bilaterally, I then dissected at the L4-5 level, identified the X-STOP device and continued my decompression until I had it visualized.  It was quite adherent and I was concerned that it would be causing injury to crack by just removing it bulk.  As such, I went below to the L5 spinous process, using a double-action Leksell rongeur and removed the entire spinous process.  I then performed a laminotomy of L5.  This allowed me to safely get underneath the X-STOP and then removed itself quite easily.  At this point, I completed the L5 central laminectomy, and continued my dissection through the quite adherent to the scar tissue that was at the L4-5 level.  Not only was it more beneficial and it made removing X-STOP easier by starting at the level below, but even decompression itself was somewhat less complicated because of the starting  below the level of the adhesions from previous surgery.  Once I completed the L5 laminectomy using 2 and 3 mm Kerrison. I continued my dissection superiorly to the L3-4 level.  Again, I performed a complete L4 laminectomy with a Kerrison rongeurs, and then a partial laminotomy of L3.  I dissected into the lateral recess until I could palpate the L4-5 and S1 pedicles.  I then checked all of the nerves to ensure that they were adequately decompressed.  I then used bipolar electrocautery to obtain hemostasis by coagulating the epidural veins.  Once the decompression was complete, [facility] could clearly pass above the L3-4  disk, and inferiorly in the central region and lateral recess and foramen at the L3-4, L4-5, and even the L5-S1 level.  Once the decompression was complete, I then identified using biplanar fluoro at the L4 level.  I then advanced the Jamshidi needle through the pedicle and into the vertebral body.  I confirmed not only directly by visualizing the pedicles, but also with x-ray that they were in satisfactory position.  I inflated the balloons and then removed and then placed the cement.  I had adequate fill in both planes, and there was no leak.  At this point, I irrigated the wound copiously with normal saline, placed thrombin-soaked Gelfoam patty over the exposed thecal sac and closed the deep fascia with interrupted #1 Vicryl sutures.  Then used 2- 0 Vicryl sutures, and 3-0 Monocryl for the skin.  Steri-Strips and a dry dressing were then applied.  It should be noted that during positioning, the patient had a skin tear on the left and right forearm.  These were dressed with Neosporin, Adaptic, and dry dressing with paper tape.  The family and patient were notified about this.  The patient was ultimately extubated and transferred to PACU without incident.  At the end of the case, all needle and sponge counts were correct.  There was no adverse intraoperative events.  My PA, who was instrumental in assisting with retractions, visualization, and wound closure.     Dahlia Bailiff, MD     DDB/MEDQ  D:  09/13/2013  T:  09/14/2013  Job:  [id number]  cc:   Alyson Locket. Ricard Dillon, P.A.

## 2013-09-14 NOTE — Care Management Note (Signed)
CARE MANAGEMENT NOTE 09/14/2013  Patient:  Megan Page, Megan Page   Account Number:  [id number]  Date Initiated:  09/14/2013  Documentation initiated by:  Ricki Miller  Subjective/Objective Assessment:   80 yr old female s/p L4 kyphoplasty, L3-S1 decompression and removal of Xstop.     Action/Plan:   Patient is for shortterm rehab at [facility]. Patient wants to go to [facility] or [name]. Social Worker is aware.   Anticipated DC Date:  09/17/2013   Anticipated DC Plan:  SKILLED NURSING FACILITY  In-house referral  Clinical Social Worker      DC Planning Services  CM consult      Choice offered to / List presented to:     DME arranged  NA        [place] arranged  NA      Status of service:  In process, will continue to follow

## 2013-09-14 NOTE — Discharge Summary (Signed)
Patient ID: Megan Page MRN: [id number] DOB/AGE: 10/12/1928 80 y.o.  Admit date: 09/13/2013 Discharge date: 09/14/2013  Admission Diagnoses:  Active Problems:   Back pain   Discharge Diagnoses:  Active Problems:   Back pain  status post Procedure(s): L4 KYPHOPLASTY L3=S1 DECOMPRESSION AND REMOVAL OF X-STOP  Past Medical History  Diagnosis Date  . Hypertension   . Hypercholesteremia   . Anxiety     just for surgery  . Headache(784.0)   . Cancer     left breast cancer  . Arthritis     back, knees  . Ruptured lumbar disc   . History of kidney stones   . Thrush     hx of from antibiotics  . Yeast infection     hx of because of antibiotics  . Baker's cyst of knee     left knee  . Complication of anesthesia     wakes up really fast, sometimes close to end of surgery  . Restless leg syndrome     medicine controlled  . GERD (gastroesophageal reflux disease)     no longer taking meds for this  . History of blood transfusion     with knee replacement  . Spastic colon     Surgeries: Procedure(s): L4 KYPHOPLASTY L3=S1 DECOMPRESSION AND REMOVAL OF X-STOP on 09/13/2013   Consultants:    Discharged Condition: Improved  Hospital Course: Megan Page is an 80 y.o. female who was admitted 09/13/2013 for operative treatment of lumbar stenosis and L4 compression fracture. Patient failed conservative treatments (please see the history and physical for the specifics) and had severe unremitting pain that affects sleep, daily activities and work/hobbies. After pre-op clearance, the patient was taken to the operating room on 09/13/2013 and underwent  Procedure(s): L4 KYPHOPLASTY L3=S1 DECOMPRESSION AND REMOVAL OF X-STOP.    Patient was given perioperative antibiotics: Anti-infectives   Start     Dose/Rate Route Frequency Ordered Stop   09/13/13 1530  ceFAZolin (ANCEF) IVPB 1 g/50 mL premix     1 g 100 mL/hr over 30 Minutes Intravenous Every 8 hours 09/13/13 1455 09/13/13 2336    09/12/13 1421  ceFAZolin (ANCEF) IVPB 2 g/50 mL premix     2 g 100 mL/hr over 30 Minutes Intravenous 30 min pre-op 09/12/13 1421 09/13/13 0730       Patient was given sequential compression devices and early ambulation to prevent DVT.   Patient benefited maximally from hospital stay and there were no complications. At the time of discharge, the patient was urinating/moving their bowels without difficulty, tolerating a regular diet, pain is controlled with oral pain medications and they have been cleared by PT/OT.   Recent vital signs: Patient Vitals for the past 24 hrs:  BP Temp Temp src Pulse Resp SpO2  09/14/13 0553 162/75 mmHg - - - - -  09/14/13 0516 173/91 mmHg 97.9 F (36.6 C) - 90 16 98 %  09/14/13 0058 132/50 mmHg 97.8 F (36.6 C) - 75 16 95 %  09/13/13 2035 137/60 mmHg 97.7 F (36.5 C) - 72 16 98 %  09/13/13 1415 137/59 mmHg 97.5 F (36.4 C) Oral 66 16 96 %  09/13/13 1400 112/49 mmHg - - 58 15 100 %  09/13/13 1345 112/53 mmHg 97.8 F (36.6 C) - 58 14 100 %     Recent laboratory studies: No results found for this basename: WBC, HGB, HCT, PLT, NA, K, CL, CO2, BUN, CREATININE, GLUCOSE, PT, INR, CALCIUM, 2,  in the last  72 hours   Discharge Medications:     Medication List    STOP taking these medications       acetaminophen 325 MG tablet  Commonly known as:  TYLENOL     HYDROcodone-acetaminophen 5-325 MG per tablet  Commonly known as:  NORCO/VICODIN     zolpidem 10 MG tablet  Commonly known as:  AMBIEN      TAKE these medications       amLODipine 10 MG tablet  Commonly known as:  NORVASC  Take 10 mg by mouth daily.     ammonium lactate 12 % cream  Commonly known as:  AMLACTIN  Apply topically daily. Lac-hydrin     anastrozole 1 MG tablet  Commonly known as:  ARIMIDEX  Take 1 mg by mouth daily.     calcium carbonate 1250 MG tablet  Commonly known as:  OS-CAL - dosed in mg of elemental calcium  Take 1 tablet by mouth daily.     carvedilol 25 MG  tablet  Commonly known as:  COREG  Take 25 mg by mouth 2 (two) times daily with a meal.     cholecalciferol 1000 UNITS tablet  Commonly known as:  VITAMIN D  Take 1,000 Units by mouth daily.     docusate sodium 100 MG capsule  Commonly known as:  COLACE  Take 1 capsule (100 mg total) by mouth 2 (two) times daily.     fluticasone 50 MCG/ACT nasal spray  Commonly known as:  FLONASE  Place 1 spray into the nose daily.     gabapentin 300 MG capsule  Commonly known as:  NEURONTIN  Take 600 mg by mouth 3 (three) times daily.     gabapentin 300 MG capsule  Commonly known as:  NEURONTIN  Take 600 mg by mouth 3 (three) times daily.     hydrALAZINE 50 MG tablet  Commonly known as:  APRESOLINE  Take 50 mg by mouth 4 (four) times daily.     losartan 100 MG tablet  Commonly known as:  COZAAR  Take 100 mg by mouth daily.     ondansetron 4 MG disintegrating tablet  Commonly known as:  ZOFRAN ODT  Take 1 tablet (4 mg total) by mouth every 8 (eight) hours as needed.     oxyCODONE-acetaminophen 7.5-325 MG per tablet  Commonly known as:  PERCOCET  Take 1 tablet by mouth every 6 (six) hours as needed for pain.     oxymetazoline 0.05 % nasal spray  Commonly known as:  AFRIN  Place 2 sprays into the nose daily as needed for congestion.     polyethylene glycol packet  Commonly known as:  MIRALAX / GLYCOLAX  Take 17 g by mouth daily.     pravastatin 20 MG tablet  Commonly known as:  PRAVACHOL  Take 20 mg by mouth daily.     solifenacin 5 MG tablet  Commonly known as:  VESICARE  Take 5 mg by mouth every other day.     tiZANidine 4 MG tablet  Commonly known as:  ZANAFLEX  Take 1 tablet (4 mg total) by mouth every 8 (eight) hours as needed for muscle spasms.     triamcinolone 0.025 % cream  Commonly known as:  KENALOG  Apply 1 application topically daily. Apply to legs        Diagnostic Studies: Dg Chest 2 View  09/04/2013   CLINICAL DATA:  Preop for decompression and  kyphoplasty  EXAM: CHEST  2 VIEW  COMPARISON:  Chest x-ray of 08/25/2012  FINDINGS: No active infiltrate or effusion is seen. Linear scarring is noted better seen on the lateral view probably within an anterior upper lobe. Some peribronchial thickening may indicate bronchitis. Mediastinal and hilar contours are unremarkable and the heart is within normal limits in size. There are degenerative changes diffusely throughout the thoracic spine  IMPRESSION: No active cardiopulmonary disease. Probable linear scarring anteriorly within an upper lobe.   Electronically Signed   By: Ivar Drape M.D.   On: 09/04/2013 14:32   Dg Lumbar Spine 2-3 Views  09/13/2013   CLINICAL DATA:  Stenosis at L4 compression fracture  EXAM: DG C-ARM 61-120 MIN; LUMBAR SPINE - 2-3 VIEW  COMPARISON:  None  FINDINGS: Frontal and lateral views were obtained. There is evidence of laminectomy from L4 to S1. Patient has had undergone kyphoplasty procedure at L4. There is no spondylolisthesis. There is slight disc space narrowing at L4-5.  IMPRESSION: Status post kyphoplasty at L4. Laminectomy defects from L4 to S1. No spondylolisthesis.   Electronically Signed   By: Lowella Grip M.D.   On: 09/13/2013 11:24   Dg C-arm 1-60 Min  09/13/2013   CLINICAL DATA:  Stenosis at L4 compression fracture  EXAM: DG C-ARM 61-120 MIN; LUMBAR SPINE - 2-3 VIEW  COMPARISON:  None  FINDINGS: Frontal and lateral views were obtained. There is evidence of laminectomy from L4 to S1. Patient has had undergone kyphoplasty procedure at L4. There is no spondylolisthesis. There is slight disc space narrowing at L4-5.  IMPRESSION: Status post kyphoplasty at L4. Laminectomy defects from L4 to S1. No spondylolisthesis.   Electronically Signed   By: Lowella Grip M.D.   On: 09/13/2013 11:24        Discharge Plan:  discharge to SNF when bed available      Signed: Lanae Crumbly for Dr. Melina Schools [facility], [facility] [phone] 09/14/2013, 1:42  PM

## 2013-09-14 NOTE — Progress Notes (Signed)
    Subjective: Procedure(s) (LRB): L4 KYPHOPLASTY L3=S1 DECOMPRESSION AND REMOVAL OF X-STOP (N/A) 1 Day Post-Op  Patient reports pain as 4 on 0-10 scale.  Reports decreased leg pain reports incisional back pain   Positive void Negative bowel movement Positive flatus Negative chest pain or shortness of breath  Objective: Vital signs in last 24 hours: Temp:  [95.2 F (35.1 C)-97.9 F (36.6 C)] 97.9 F (36.6 C) (07/10 0516) Pulse Rate:  [54-90] 90 (07/10 0516) Resp:  [10-17] 16 (07/10 0516) BP: (102-173)/(47-91) 162/75 mmHg (07/10 0553) SpO2:  [95 %-100 %] 98 % (07/10 0516)  Intake/Output from previous day: 07/09 0701 - 07/10 0700 In: 2275 [I.V.:2125; IV Piggyback:150] Out: 3115 [Urine:2915; Blood:200]  Labs: No results found for this basename: WBC, RBC, HCT, PLT,  in the last 72 hours No results found for this basename: NA, K, CL, CO2, BUN, CREATININE, GLUCOSE, CALCIUM,  in the last 72 hours No results found for this basename: LABPT, INR,  in the last 72 hours  Physical Exam: Neurologically intact ABD soft Intact pulses distally Incision: dressing C/D/I Compartment soft  Assessment/Plan: Patient stable  xrays n/a Continue mobilization with physical therapy Continue care  Advance diet Up with therapy Discharge to SNF most likely on Monday  Melina Schools, [place] [phone]

## 2013-09-14 NOTE — Evaluation (Signed)
Occupational Therapy Evaluation and Discharge Patient Details Name: Megan Page MRN: [id number] DOB: 12-24-1928 Today's Date: 09/14/2013    History of Present Illness pt presents with L3-S1 Decompression and hardware removal.     Clinical Impression   Pta pt was at a modified independence level for ADLs and now from above presents with generalized weakness, limited ROM and acute pain interfering with her independence with self care tasks. Educated pt on compensatory strategies for BADLs/IADLs, bed mobility technique, reviewed precautions and wearing schedule for back brace. All acute education has been completed and pt will benefit from continued OT at [facility]. Acute OT will sign off.    Follow Up Recommendations  SNF;Supervision/Assistance - 24 hour    Equipment Recommendations   (TBD at next venue)       Precautions / Restrictions Precautions Precautions: Back Precaution Booklet Issued: Yes (comment) Precaution Comments: Reviewed back precautions and gave pt handout Required Braces or Orthoses: Spinal Brace Spinal Brace: Lumbar corset;Applied in sitting position Restrictions Weight Bearing Restrictions: No      Mobility Bed Mobility Overal bed mobility: Needs Assistance       General bed mobility comments: Pt up in chair upon OT tx but educated pt on log rolling and sidelying to sit technique  Transfers Overall transfer level: Needs assistance Equipment used: Rolling walker (2 wheeled) Transfers: Sit to/from Stand Sit to Stand: Min guard         General transfer comment: VC for hand placement    Balance Overall balance assessment: Needs assistance Sitting-balance support: No upper extremity supported;Feet supported Sitting balance-Leahy Scale: Good     Standing balance support: Single extremity supported;During functional activity Standing balance-Leahy Scale: Fair                              ADL Overall ADL's : Needs  assistance/impaired Eating/Feeding: Independent;Sitting   Grooming: Set up;Sitting   Upper Body Bathing: Set up;Sitting   Lower Body Bathing: Sit to/from stand;Adhering to back precautions;Maximal assistance (for lower portion of legs and feet)   Upper Body Dressing : Set up;Sitting   Lower Body Dressing: Adhering to back precautions;Sit to/from stand;Maximal assistance   Toilet Transfer: Min guard;BSC;Ambulation;RW;Cueing for sequencing   Toileting- Clothing Manipulation and Hygiene: Maximal assistance;Sit to/from stand;Adhering to back precautions       Functional mobility during ADLs: Rolling walker;Min guard General ADL Comments: Reviewed back precautions with pt and educated her on LB bathing/dressing technique. Pt was having pain in her LLE while standing therefore pt performed bathing and grooming exercises sitting in the bathroom. Educated pt on oral care using 2 cups (one for spitting and one for rinsing) to adhere to back precautions. VC needed throughout tx for no bending precaution.               Pertinent Vitals/Pain No c/o pain during tx.     Hand Dominance Right   Extremity/Trunk Assessment Upper Extremity Assessment Upper Extremity Assessment: Overall WFL for tasks assessed   Lower Extremity Assessment Lower Extremity Assessment: Defer to PT evaluation   Cervical / Trunk Assessment Cervical / Trunk Assessment: Normal   Communication Communication Communication: No difficulties   Cognition Arousal/Alertness: Awake/alert Behavior During Therapy: WFL for tasks assessed/performed Overall Cognitive Status: Within Functional Limits for tasks assessed  Home Living Family/patient expects to be discharged to:: Skilled nursing facility                                        Prior Functioning/Environment Level of Independence: Needs assistance    ADL's / Homemaking Assistance Needed: daughters  would help with LB dressing when available; pt states she has become less mobile due to pain        OT Diagnosis: Generalized weakness;Acute pain   OT Problem List: Decreased strength;Decreased range of motion;Decreased safety awareness;Impaired balance (sitting and/or standing);Pain   OT Treatment/Interventions:      OT Goals(Current goals can be found in the care plan section) Acute Rehab OT Goals Patient Stated Goal: to get better   End of Session Equipment Utilized During Treatment: Rolling walker;Back brace Nurse Communication:  (Pt urinated, and had been bathed.)  Activity Tolerance: Patient tolerated treatment well Patient left: in chair;with call bell/phone within reach   Time:  -    Charges:    G-CodesLyda Perone 07-Oct-2013, 1:43 PM

## 2013-09-14 NOTE — Evaluation (Signed)
I have read and agree with this note.   Time in/out:9:47-10:40 Total time:53 minutes (Ev, 3 [place])  [place], OTR/L [phone]

## 2013-09-14 NOTE — Progress Notes (Signed)
CSW provided bed offer to patient. Patient chose Megan Page. CSW continues to follow for when patient is medically ready for SNF.  Jeanette Caprice, MSW, [place]

## 2013-09-14 NOTE — Evaluation (Signed)
Physical Therapy Evaluation Patient Details Name: Megan Page MRN: [id number] DOB: [birth year] Today's Date: 09/14/2013   History of Present Illness  pt presents with L3-S1 Decompression and hardware removal.    Clinical Impression  Pt very motivated and anticipate great progress.  Pt plans to D/C to SNF to maximize independence prior to return to home.  Will continue to follow.      Follow Up Recommendations SNF    Equipment Recommendations  None recommended by PT    Recommendations for Other Services       Precautions / Restrictions Precautions Precautions: Back Precaution Booklet Issued: Yes (comment) Precaution Comments: Reviewed back precautions and gave pt handout Required Braces or Orthoses: Spinal Brace Spinal Brace: Lumbar corset;Applied in sitting position Restrictions Weight Bearing Restrictions: No      Mobility  Bed Mobility Overal bed mobility: Needs Assistance Bed Mobility: Rolling;Sidelying to Sit Rolling: Min guard Sidelying to sit: Min assist       General bed mobility comments: cues for log roll technique and A with bringing trunk up to sitting.    Transfers Overall transfer level: Needs assistance Equipment used: Rolling walker (2 wheeled) Transfers: Sit to/from Stand Sit to Stand: Min assist         General transfer comment: A with standing from bed.  cues for UE use.    Ambulation/Gait Ambulation/Gait assistance: Min guard Ambulation Distance (Feet): 80 Feet Assistive device: Rolling walker (2 wheeled) Gait Pattern/deviations: Step-through pattern;Decreased stride length;Trunk flexed     General Gait Details: cues for positioning within RW, upright posture.    Stairs            Wheelchair Mobility    Modified Rankin (Stroke Patients Only)       Balance Overall balance assessment: Needs assistance Sitting-balance support: No upper extremity supported;Feet supported Sitting balance-Leahy Scale: Good     Standing  balance support: Single extremity supported;During functional activity Standing balance-Leahy Scale: Fair                               Pertinent Vitals/Pain 4/10.  Premedicated.      Home Living Family/patient expects to be discharged to:: Skilled nursing facility                      Prior Function Level of Independence: Needs assistance      ADL's / Homemaking Assistance Needed: daughters would help with LB dressing when available; pt states she has become less mobile due to pain        Hand Dominance   Dominant Hand: Right    Extremity/Trunk Assessment   Upper Extremity Assessment: Defer to OT evaluation           Lower Extremity Assessment: Generalized weakness      Cervical / Trunk Assessment: Normal  Communication   Communication: No difficulties  Cognition Arousal/Alertness: Awake/alert Behavior During Therapy: WFL for tasks assessed/performed Overall Cognitive Status: Within Functional Limits for tasks assessed                      General Comments      Exercises        Assessment/Plan    PT Assessment Patient needs continued PT services  PT Diagnosis Difficulty walking;Acute pain   PT Problem List Decreased strength;Decreased activity tolerance;Decreased balance;Decreased mobility;Decreased coordination;Decreased knowledge of use of DME;Decreased knowledge of precautions;Pain  PT Treatment Interventions DME instruction;Gait training;Stair  training;Functional mobility training;Therapeutic activities;Therapeutic exercise;Balance training;Patient/family education   PT Goals (Current goals can be found in the Care Plan section) Acute Rehab PT Goals Patient Stated Goal: to get better PT Goal Formulation: With patient Time For Goal Achievement: 09/28/13 Potential to Achieve Goals: Good    Frequency Min 5X/week   Barriers to discharge        Co-evaluation               End of Session Equipment Utilized  During Treatment: Gait belt;Back brace Activity Tolerance: Patient tolerated treatment well Patient left: in chair;with call bell/phone within reach (with OT.  ) Nurse Communication: Mobility status         Time: 0383-3383 PT Time Calculation (min): 25 min   Charges:   PT Evaluation $Initial PT Evaluation Tier I: 1 Procedure PT Treatments $Gait Training: 8-22 mins   PT G CodesCatarina Hartshorn, [place] [phone] 09/14/2013, 1:16 PM

## 2013-09-14 NOTE — Consult Note (Addendum)
WOC wound consult note Reason for Consult: evaluation of skin tears.  Pt with very frail thin skin.  Wound type: skin tear R forearm and L forearm Measurement: RFA: linear area 6.5 (horizontal), skin flap intact LFA: vertical linear area 2.5cm , skin flap intact Wound bed: both are clean with thin film in place Drainage (amount, consistency, odor) none at the LFA, some serosanguinous noted on the RFA  Periwound: intact but with ecchymosis over the left and right forearms  Dressing procedure/placement/frequency: Thin films have been placed over both sites, however due to the patients fragile skin and the aggressiveness of the adhesive in a thin film dressing I would recommend these to stay in place for at least 10 days if possible or until they turn loose themselves.  I have explained the rationale for this to the patient and to the daughter.  Orders written for care and for dc to SNF.   DC instruction for SNF: 1. Leave thin film dressings in place to the bilateral UE until the loosen.  If collection of drainage under the thin film, ok to carefully make small snip in the thin film and drain fluid out with gauze topper to absorb drainage. 2. Wrap arms in conform gauze or kerlix and secure with stockinette (ok to reuse) or tube sock (if family provides). No tape. 3. Should the thin film come off or need to be removed, keep skin flap approximated and cover with Vaseline gauze and top with dry dressing, kerlix or conform, stockinette.  Discussed POC with patient and bedside nurse.  Re consult if needed, will not follow at this time. Thanks  Ambrea Hegler Kellogg, [place] [phone])

## 2013-09-15 MED ORDER — GABAPENTIN 600 MG PO TABS
600.0000 mg | ORAL_TABLET | Freq: Three times a day (TID) | ORAL | Status: DC
  Administered 2013-09-15 – 2013-09-17 (×6): 600 mg via ORAL
  Filled 2013-09-15 (×9): qty 1

## 2013-09-15 MED ORDER — HYDROCODONE-ACETAMINOPHEN 5-325 MG PO TABS
1.0000 | ORAL_TABLET | Freq: Four times a day (QID) | ORAL | Status: DC | PRN
  Administered 2013-09-15 – 2013-09-17 (×8): 1 via ORAL
  Filled 2013-09-15 (×8): qty 1

## 2013-09-15 NOTE — Progress Notes (Signed)
    Subjective: Procedure(s) (LRB): L4 KYPHOPLASTY L3=S1 DECOMPRESSION AND REMOVAL OF X-STOP (N/A) 2 Days Post-Op  Patient reports pain as 2 on 0-10 scale.  Reports decreased leg pain reports incisional back pain   Positive void Positive bowel movement Positive flatus Negative chest pain or shortness of breath  Objective: Vital signs in last 24 hours: Temp:  [97.8 F (36.6 C)-98.2 F (36.8 C)] 97.9 F (36.6 C) (07/11 0620) Pulse Rate:  [80-88] 88 (07/11 0620) Resp:  [18-20] 20 (07/11 0620) BP: (139-169)/(56-65) 169/64 mmHg (07/11 0620) SpO2:  [95 %-98 %] 97 % (07/11 0620)  Intake/Output from previous day: 07/10 0701 - 07/11 0700 In: -  Out: 1700 [Urine:1700]  Labs: No results found for this basename: WBC, RBC, HCT, PLT,  in the last 72 hours No results found for this basename: NA, K, CL, CO2, BUN, CREATININE, GLUCOSE, CALCIUM,  in the last 72 hours No results found for this basename: LABPT, INR,  in the last 72 hours  Physical Exam: Neurologically intact ABD soft Incision: dressing C/D/I Compartment soft patient confused - A+O to person.  Unclear as to location and time.  Does recognize me, and recalls recent events  Assessment/Plan: Patient stable  xrays n/a Continue mobilization with physical therapy Continue care  Up with therapy  D/C MSO4, oxycodone, muscle relaxors, and Ambien.  Patient was without confusion until this AM.  Only received Ambien last night - will monitor mental status.  If worsening will call medical consult. Await SNF placement on Monday  Melina Schools, MD [place] [phone]

## 2013-09-15 NOTE — Progress Notes (Signed)
Physical Therapy Treatment Patient Details Name: Megan Page MRN: [id number] DOB: 1928-04-07 Today's Date: 09/15/2013    History of Present Illness pt presents with L3-S1 Decompression and hardware removal.      PT Comments    Patient is progressing well towards physical therapy goals, ambulating up to 100 feet x2 requiring several standing rest breaks due to pain, but overall showing good stability with rolling walker. Verbalizes 2/3 back precautions. Patient will continue to benefit from skilled physical therapy services to further improve independence with functional mobility.    Follow Up Recommendations  SNF     Equipment Recommendations  None recommended by PT    Recommendations for Other Services       Precautions / Restrictions Precautions Precautions: Back Precaution Comments: Reviewed back precautions able to recall 2/3 correctly Required Braces or Orthoses: Spinal Brace Spinal Brace: Lumbar corset;Applied in sitting position Restrictions Weight Bearing Restrictions: No    Mobility  Bed Mobility                  Transfers Overall transfer level: Needs assistance Equipment used: Rolling walker (2 wheeled) Transfers: Sit to/from Stand Sit to Stand: Min guard         General transfer comment: Min guard for safety with VCs for hand placement. Requires extra time.  Good stability upon standing  Ambulation/Gait Ambulation/Gait assistance: Min guard Ambulation Distance (Feet): 100 Feet (x2) Assistive device: Rolling walker (2 wheeled) Gait Pattern/deviations: Step-to pattern;Step-through pattern;Decreased stride length   Gait velocity interpretation: Below normal speed for age/gender General Gait Details: Min guard for safety with VCs for upright posture. Educated on safe DME use with instruction for step-through gait pattern. Required several standing rest breaks due to pain.   Stairs            Wheelchair Mobility    Modified Rankin  (Stroke Patients Only)       Balance                                    Cognition Arousal/Alertness: Awake/alert Behavior During Therapy: WFL for tasks assessed/performed Overall Cognitive Status: Within Functional Limits for tasks assessed                      Exercises General Exercises - Lower Extremity Ankle Circles/Pumps: AROM;Both;10 reps;Seated Gluteal Sets: Strengthening;Both;5 reps;Seated Long Arc Quad: AROM;Both;10 reps;Seated    General Comments General comments (skin integrity, edema, etc.): Educated on back precautions throughout therapy with all mobility. Pt safely maintaining these however only able to recall 2/3 correctly. Questions answered concerning OOB mobility and therapeutic exercises.      Pertinent Vitals/Pain Pt reports pain as moderate - nursing administered pain medication during therapy session Patient repositioned in chair for comfort.      Home Living                      Prior Function            PT Goals (current goals can now be found in the care plan section) Acute Rehab PT Goals PT Goal Formulation: With patient Time For Goal Achievement: 09/28/13 Potential to Achieve Goals: Good Progress towards PT goals: Progressing toward goals    Frequency  Min 5X/week    PT Plan Current plan remains appropriate    Co-evaluation             End  of Session Equipment Utilized During Treatment: Back brace Activity Tolerance: Patient tolerated treatment well Patient left: in chair;with call bell/phone within reach;with family/visitor present     Time: 8850-2774 PT Time Calculation (min): 23 min  Charges:  $Gait Training: 8-22 mins $Self Care/Home Management: 8-22                    G Codes:      Elayne Snare, [place]  Ellouise Newer 09/15/2013, 2:48 PM

## 2013-09-16 NOTE — Progress Notes (Signed)
Orthopedics Progress Note  Subjective: I feel better today and am catching up on rest.  I am planning on going to [place].  Objective:  Filed Vitals:   09/16/13 0526  BP: 123/71  Pulse: 82  Temp: 97.8 F (36.6 C)  Resp: 20    General: Awake and alert  Musculoskeletal: back incision clean and dry and intact, no erythema, 5/5 motor bilateral LE, sensation intact Neurovascularly intact  Lab Results  Component Value Date   WBC 9.2 09/04/2013   HGB 12.8 09/04/2013   HCT 40.5 09/04/2013   MCV 95.1 09/04/2013   PLT 306 09/04/2013       Component Value Date/Time   NA 144 09/04/2013 1243   K 4.3 09/04/2013 1243   CL 104 09/04/2013 1243   CO2 26 09/04/2013 1243   GLUCOSE 166* 09/04/2013 1243   BUN 20 09/04/2013 1243   CREATININE 0.79 09/04/2013 1243   CALCIUM 9.6 09/04/2013 1243   GFRNONAA 75* 09/04/2013 1243   GFRAA 87* 09/04/2013 1243    Lab Results  Component Value Date   INR 0.99 10/20/2012    Assessment/Plan: POD #3 s/p Procedure(s): L4 KYPHOPLASTY L3=S1 DECOMPRESSION AND REMOVAL OF X-STOP Stable and ready for D/C to [facility] tomorrow. D/C planning  Doran Heater. Veverly Fells, MD 09/16/2013 8:04 AM

## 2013-09-17 ENCOUNTER — Encounter (HOSPITAL_COMMUNITY): Payer: Self-pay | Admitting: Orthopedic Surgery

## 2013-09-17 DIAGNOSIS — M1710 Unilateral primary osteoarthritis, unspecified knee: Secondary | ICD-10-CM | POA: Diagnosis not present

## 2013-09-17 DIAGNOSIS — J309 Allergic rhinitis, unspecified: Secondary | ICD-10-CM | POA: Diagnosis not present

## 2013-09-17 DIAGNOSIS — M549 Dorsalgia, unspecified: Secondary | ICD-10-CM | POA: Diagnosis not present

## 2013-09-17 DIAGNOSIS — M7020 Olecranon bursitis, unspecified elbow: Secondary | ICD-10-CM | POA: Diagnosis not present

## 2013-09-17 DIAGNOSIS — L89109 Pressure ulcer of unspecified part of back, unspecified stage: Secondary | ICD-10-CM | POA: Diagnosis not present

## 2013-09-17 DIAGNOSIS — I4891 Unspecified atrial fibrillation: Secondary | ICD-10-CM | POA: Diagnosis not present

## 2013-09-17 DIAGNOSIS — Z4789 Encounter for other orthopedic aftercare: Secondary | ICD-10-CM | POA: Diagnosis not present

## 2013-09-17 DIAGNOSIS — M129 Arthropathy, unspecified: Secondary | ICD-10-CM | POA: Diagnosis not present

## 2013-09-17 DIAGNOSIS — Z853 Personal history of malignant neoplasm of breast: Secondary | ICD-10-CM | POA: Diagnosis not present

## 2013-09-17 DIAGNOSIS — G2581 Restless legs syndrome: Secondary | ICD-10-CM | POA: Diagnosis not present

## 2013-09-17 DIAGNOSIS — L89309 Pressure ulcer of unspecified buttock, unspecified stage: Secondary | ICD-10-CM | POA: Diagnosis not present

## 2013-09-17 DIAGNOSIS — M545 Low back pain, unspecified: Secondary | ICD-10-CM | POA: Diagnosis not present

## 2013-09-17 DIAGNOSIS — R262 Difficulty in walking, not elsewhere classified: Secondary | ICD-10-CM | POA: Diagnosis not present

## 2013-09-17 DIAGNOSIS — IMO0002 Reserved for concepts with insufficient information to code with codable children: Secondary | ICD-10-CM | POA: Diagnosis not present

## 2013-09-17 DIAGNOSIS — E7800 Pure hypercholesterolemia, unspecified: Secondary | ICD-10-CM | POA: Diagnosis not present

## 2013-09-17 DIAGNOSIS — F411 Generalized anxiety disorder: Secondary | ICD-10-CM | POA: Diagnosis not present

## 2013-09-17 DIAGNOSIS — K589 Irritable bowel syndrome without diarrhea: Secondary | ICD-10-CM | POA: Diagnosis not present

## 2013-09-17 DIAGNOSIS — I10 Essential (primary) hypertension: Secondary | ICD-10-CM | POA: Diagnosis not present

## 2013-09-17 DIAGNOSIS — R51 Headache: Secondary | ICD-10-CM | POA: Diagnosis not present

## 2013-09-17 DIAGNOSIS — M7120 Synovial cyst of popliteal space [Baker], unspecified knee: Secondary | ICD-10-CM | POA: Diagnosis not present

## 2013-09-17 DIAGNOSIS — C50919 Malignant neoplasm of unspecified site of unspecified female breast: Secondary | ICD-10-CM | POA: Diagnosis not present

## 2013-09-17 DIAGNOSIS — L89609 Pressure ulcer of unspecified heel, unspecified stage: Secondary | ICD-10-CM | POA: Diagnosis not present

## 2013-09-17 DIAGNOSIS — K219 Gastro-esophageal reflux disease without esophagitis: Secondary | ICD-10-CM | POA: Diagnosis not present

## 2013-09-17 DIAGNOSIS — Z5189 Encounter for other specified aftercare: Secondary | ICD-10-CM | POA: Diagnosis not present

## 2013-09-17 DIAGNOSIS — M48061 Spinal stenosis, lumbar region without neurogenic claudication: Secondary | ICD-10-CM | POA: Diagnosis not present

## 2013-09-17 NOTE — Progress Notes (Signed)
CSW (Clinical Education officer, museum) prepared pt dc packet and placed with shadow chart. CSW arranged non-emergent ambulance transport. Pt, pt nurse, and facility informed. Pt informed CSW not to call family she would inform her daughter of dc. CSW signing off.

## 2013-09-17 NOTE — Clinical Documentation Improvement (Signed)
Possible Clinical conditions  Morbid Obesity W/ BMI 46.07  Other condition___________________  Cannot clinically determine _____________  Risk Factors: Height:  4'5"  Weight 184 lbs. BMI 46.07  Thank You, Estella Husk ,RN Clinical Documentation Specialist:  [facility] [employer] [facility]

## 2013-09-17 NOTE — Progress Notes (Signed)
CARE MANAGEMENT NOTE 09/17/2013  Patient:  ETHLYN, ALTO   Account Number:  [id number]  Date Initiated:  09/14/2013  Documentation initiated by:  Ricki Miller  Subjective/Objective Assessment:   80 yr old female s/p L4 kyphoplasty, L3-S1 decompression and removal of Xstop.     Action/Plan:   Patient is for shortterm rehab at [facility]. Patient wants to go to [facility] or [name]. Social Worker is aware.   Anticipated DC Date:  09/17/2013   Anticipated DC Plan:  SKILLED NURSING FACILITY  In-house referral  Clinical Social Worker      DC Planning Services  CM consult      Choice offered to / List presented to:     DME arranged  NA        [place] arranged  NA      Status of service:  Completed, signed off Medicare Important Message given?  YES (If response is "NO", the following Medicare IM given date fields will be blank) Date Medicare IM given:  09/17/2013 Medicare IM given by: [place] Plan  Date Additional Medicare IM given:   Additional Medicare IM given by:    Discharge Disposition:  [place]  Per UR Regulation:  Reviewed for med. necessity/level of care/duration of stay  If discussed at [place] of Stay Meetings, dates discussed:    Comments:

## 2013-09-17 NOTE — Progress Notes (Signed)
Physical Therapy Treatment Patient Details Name: Anayeli Arel MRN: [id number] DOB: [birth year] Today's Date: 09/17/2013    History of Present Illness pt presents with L3-S1 Decompression and hardware removal.      PT Comments    Patient is progressing well towards physical therapy goals, ambulating up to 100 feet x2 with one standing rest break while using rolling walker. Requires intermittent cues to maintain back precautions, especially with bed mobility. Will continue to follow until d/c. Patient will continue to benefit from skilled physical therapy services at SNF to further improve independence with functional mobility.    Follow Up Recommendations  SNF     Equipment Recommendations  None recommended by PT    Recommendations for Other Services       Precautions / Restrictions Precautions Precautions: Back Precaution Comments: Reviewed back precautions able to recall 2/3 correctly Required Braces or Orthoses: Spinal Brace Spinal Brace: Lumbar corset;Applied in sitting position Restrictions Weight Bearing Restrictions: No    Mobility  Bed Mobility Overal bed mobility: Needs Assistance Bed Mobility: Rolling;Sidelying to Sit Rolling: Supervision Sidelying to sit: Supervision       General bed mobility comments: Pt intially began to get out of bed as PT entered room. Needed instructions to correctly follow log roll technique. With verbal cues pt was able to safely complete without physical assist.   Transfers Overall transfer level: Needs assistance Equipment used: Rolling walker (2 wheeled) Transfers: Sit to/from Stand Sit to Stand: Min guard         General transfer comment: Min guard for safety with VCs for hand placement. Performed from lowest bed setting and recliner x2  Ambulation/Gait Ambulation/Gait assistance: Supervision Ambulation Distance (Feet): 100 Feet (x2) Assistive device: Rolling walker (2 wheeled) Gait Pattern/deviations: Step-through  pattern;Decreased stride length   Gait velocity interpretation: Below normal speed for age/gender General Gait Details: Supervision for safety with VC for forward gaze. Demonstrates safe control of rolling walker. No loss of balance. Pt reports mild right flank pain which she reports was present prior to surgery. Required only one standing rest break during bout.   Stairs            Wheelchair Mobility    Modified Rankin (Stroke Patients Only)       Balance                                    Cognition Arousal/Alertness: Awake/alert Behavior During Therapy: WFL for tasks assessed/performed Overall Cognitive Status: Within Functional Limits for tasks assessed                      Exercises General Exercises - Lower Extremity Ankle Circles/Pumps: AROM;Both;10 reps;Seated Long Arc Quad: AROM;Both;Seated;15 reps Hip Flexion/Marching: AROM;Both;10 reps;Seated;Standing (performed x 10 standing and seated x10) Other Exercises Other Exercises: Tandem stance for balance training eyes open and closed x 20 seconds each Other Exercises: Romberg stance eyes open Lt and right 20 seconds each for balance training.    General Comments General comments (skin integrity, edema, etc.): Time spent educating patient on back precautions and log roll, reviewing handout. Discussed progression of therapy and expected outcomes along course of therapy in various venues. Instructed to follow precaution handout as she has trouble remembering restrictions.      Pertinent Vitals/Pain Pt reports pain as moderate. States her pain improved during therapy as she was able to ambulate and perform exercises. Patient repositioned in  chair for comfort.     Home Living                      Prior Function            PT Goals (current goals can now be found in the care plan section) Acute Rehab PT Goals PT Goal Formulation: With patient Time For Goal Achievement:  09/28/13 Potential to Achieve Goals: Good Progress towards PT goals: Progressing toward goals    Frequency  Min 5X/week    PT Plan Current plan remains appropriate    Co-evaluation             End of Session Equipment Utilized During Treatment: Back brace Activity Tolerance: Patient tolerated treatment well Patient left: in chair;with call bell/phone within reach;with nursing/sitter in room     Time: 5797-2820 PT Time Calculation (min): 24 min  Charges:  $Gait Training: 8-22 mins $Self Care/Home Management: 8-22                    G Codes:      Elayne Snare, [place]  Ellouise Newer 09/17/2013, 1:58 PM

## 2013-09-17 NOTE — Progress Notes (Signed)
CSW (Clinical Education officer, museum) spoke with pt and pt nurse and aware that plan is for dc today. [facility] informed of dc today. Pt will need non-emergent ambulance transport. CSW to assist once dc order in place. Pt nurse to follow up with MD for order.  Megan Page, Megan Page

## 2013-09-17 NOTE — Progress Notes (Signed)
Report called to [facility]

## 2013-09-17 NOTE — Progress Notes (Signed)
    Subjective: Procedure(s) (LRB): L4 KYPHOPLASTY L3=S1 DECOMPRESSION AND REMOVAL OF X-STOP (N/A) 4 Days Post-Op  Patient reports pain as 2 on 0-10 scale.  Reports decreased leg pain denies incisional back pain   Positive void Positive bowel movement Positive flatus Negative chest pain or shortness of breath  Objective: Vital signs in last 24 hours: Temp:  [97.8 F (36.6 C)-98.3 F (36.8 C)] 97.8 F (36.6 C) (07/13 0513) Pulse Rate:  [73-81] 81 (07/13 0513) Resp:  [18-20] 20 (07/13 0513) BP: (134-159)/(52-70) 153/69 mmHg (07/13 0513) SpO2:  [93 %-97 %] 93 % (07/13 0513)  Intake/Output from previous day: 07/12 0701 - 07/13 0700 In: 363 [P.O.:360; I.V.:3] Out: -   Labs: No results found for this basename: WBC, RBC, HCT, PLT,  in the last 72 hours No results found for this basename: NA, K, CL, CO2, BUN, CREATININE, GLUCOSE, CALCIUM,  in the last 72 hours No results found for this basename: LABPT, INR,  in the last 72 hours  Physical Exam: Neurologically intact ABD soft Incision: dressing C/D/I Compartment soft  Assessment/Plan: Patient stable  xrays n/a Continue mobilization with physical therapy Continue care  Advance diet Up with therapy Discharge to SNF when bed is ready  Melina Schools, MD [place] [phone]

## 2013-09-18 NOTE — Discharge Summary (Signed)
Patient doing well Pain controlled Ok for d/c

## 2013-09-19 DIAGNOSIS — C50919 Malignant neoplasm of unspecified site of unspecified female breast: Secondary | ICD-10-CM | POA: Diagnosis not present

## 2013-09-19 DIAGNOSIS — J309 Allergic rhinitis, unspecified: Secondary | ICD-10-CM | POA: Diagnosis not present

## 2013-09-19 DIAGNOSIS — R262 Difficulty in walking, not elsewhere classified: Secondary | ICD-10-CM | POA: Diagnosis not present

## 2013-09-19 DIAGNOSIS — M545 Low back pain, unspecified: Secondary | ICD-10-CM | POA: Diagnosis not present

## 2013-09-19 DIAGNOSIS — I4891 Unspecified atrial fibrillation: Secondary | ICD-10-CM | POA: Diagnosis not present

## 2013-09-19 DIAGNOSIS — L89109 Pressure ulcer of unspecified part of back, unspecified stage: Secondary | ICD-10-CM | POA: Diagnosis not present

## 2013-09-19 DIAGNOSIS — I10 Essential (primary) hypertension: Secondary | ICD-10-CM | POA: Diagnosis not present

## 2013-09-21 DIAGNOSIS — R262 Difficulty in walking, not elsewhere classified: Secondary | ICD-10-CM | POA: Diagnosis not present

## 2013-09-21 DIAGNOSIS — M545 Low back pain, unspecified: Secondary | ICD-10-CM | POA: Diagnosis not present

## 2013-09-24 DIAGNOSIS — M7020 Olecranon bursitis, unspecified elbow: Secondary | ICD-10-CM | POA: Diagnosis not present

## 2013-09-26 DIAGNOSIS — IMO0002 Reserved for concepts with insufficient information to code with codable children: Secondary | ICD-10-CM | POA: Diagnosis not present

## 2013-10-01 DIAGNOSIS — R262 Difficulty in walking, not elsewhere classified: Secondary | ICD-10-CM | POA: Diagnosis not present

## 2013-10-03 DIAGNOSIS — L89309 Pressure ulcer of unspecified buttock, unspecified stage: Secondary | ICD-10-CM | POA: Diagnosis not present

## 2013-10-03 DIAGNOSIS — L89609 Pressure ulcer of unspecified heel, unspecified stage: Secondary | ICD-10-CM | POA: Diagnosis not present

## 2013-10-05 DIAGNOSIS — Z853 Personal history of malignant neoplasm of breast: Secondary | ICD-10-CM | POA: Diagnosis not present

## 2013-10-05 DIAGNOSIS — R42 Dizziness and giddiness: Secondary | ICD-10-CM | POA: Diagnosis not present

## 2013-10-05 DIAGNOSIS — I10 Essential (primary) hypertension: Secondary | ICD-10-CM | POA: Diagnosis not present

## 2013-10-05 DIAGNOSIS — Z9181 History of falling: Secondary | ICD-10-CM | POA: Diagnosis not present

## 2013-10-05 DIAGNOSIS — Z4889 Encounter for other specified surgical aftercare: Secondary | ICD-10-CM | POA: Diagnosis not present

## 2013-10-10 DIAGNOSIS — Z853 Personal history of malignant neoplasm of breast: Secondary | ICD-10-CM | POA: Diagnosis not present

## 2013-10-10 DIAGNOSIS — Z9181 History of falling: Secondary | ICD-10-CM | POA: Diagnosis not present

## 2013-10-10 DIAGNOSIS — I10 Essential (primary) hypertension: Secondary | ICD-10-CM | POA: Diagnosis not present

## 2013-10-10 DIAGNOSIS — R42 Dizziness and giddiness: Secondary | ICD-10-CM | POA: Diagnosis not present

## 2013-10-10 DIAGNOSIS — Z4889 Encounter for other specified surgical aftercare: Secondary | ICD-10-CM | POA: Diagnosis not present

## 2013-10-12 DIAGNOSIS — Z4889 Encounter for other specified surgical aftercare: Secondary | ICD-10-CM | POA: Diagnosis not present

## 2013-10-12 DIAGNOSIS — I10 Essential (primary) hypertension: Secondary | ICD-10-CM | POA: Diagnosis not present

## 2013-10-12 DIAGNOSIS — Z853 Personal history of malignant neoplasm of breast: Secondary | ICD-10-CM | POA: Diagnosis not present

## 2013-10-12 DIAGNOSIS — R42 Dizziness and giddiness: Secondary | ICD-10-CM | POA: Diagnosis not present

## 2013-10-12 DIAGNOSIS — Z9181 History of falling: Secondary | ICD-10-CM | POA: Diagnosis not present

## 2013-10-15 DIAGNOSIS — Z853 Personal history of malignant neoplasm of breast: Secondary | ICD-10-CM | POA: Diagnosis not present

## 2013-10-15 DIAGNOSIS — Z9181 History of falling: Secondary | ICD-10-CM | POA: Diagnosis not present

## 2013-10-15 DIAGNOSIS — R42 Dizziness and giddiness: Secondary | ICD-10-CM | POA: Diagnosis not present

## 2013-10-15 DIAGNOSIS — Z4889 Encounter for other specified surgical aftercare: Secondary | ICD-10-CM | POA: Diagnosis not present

## 2013-10-15 DIAGNOSIS — I10 Essential (primary) hypertension: Secondary | ICD-10-CM | POA: Diagnosis not present

## 2013-10-18 DIAGNOSIS — Z9181 History of falling: Secondary | ICD-10-CM | POA: Diagnosis not present

## 2013-10-18 DIAGNOSIS — Z4889 Encounter for other specified surgical aftercare: Secondary | ICD-10-CM | POA: Diagnosis not present

## 2013-10-18 DIAGNOSIS — I10 Essential (primary) hypertension: Secondary | ICD-10-CM | POA: Diagnosis not present

## 2013-10-18 DIAGNOSIS — R42 Dizziness and giddiness: Secondary | ICD-10-CM | POA: Diagnosis not present

## 2013-10-18 DIAGNOSIS — Z853 Personal history of malignant neoplasm of breast: Secondary | ICD-10-CM | POA: Diagnosis not present

## 2013-10-23 DIAGNOSIS — Z853 Personal history of malignant neoplasm of breast: Secondary | ICD-10-CM | POA: Diagnosis not present

## 2013-10-23 DIAGNOSIS — I10 Essential (primary) hypertension: Secondary | ICD-10-CM | POA: Diagnosis not present

## 2013-10-23 DIAGNOSIS — Z9181 History of falling: Secondary | ICD-10-CM | POA: Diagnosis not present

## 2013-10-23 DIAGNOSIS — R42 Dizziness and giddiness: Secondary | ICD-10-CM | POA: Diagnosis not present

## 2013-10-23 DIAGNOSIS — Z4889 Encounter for other specified surgical aftercare: Secondary | ICD-10-CM | POA: Diagnosis not present

## 2013-10-25 DIAGNOSIS — Z4889 Encounter for other specified surgical aftercare: Secondary | ICD-10-CM | POA: Diagnosis not present

## 2013-10-25 DIAGNOSIS — Z853 Personal history of malignant neoplasm of breast: Secondary | ICD-10-CM | POA: Diagnosis not present

## 2013-10-25 DIAGNOSIS — I10 Essential (primary) hypertension: Secondary | ICD-10-CM | POA: Diagnosis not present

## 2013-10-25 DIAGNOSIS — Z9181 History of falling: Secondary | ICD-10-CM | POA: Diagnosis not present

## 2013-10-25 DIAGNOSIS — R42 Dizziness and giddiness: Secondary | ICD-10-CM | POA: Diagnosis not present

## 2013-10-30 DIAGNOSIS — Z853 Personal history of malignant neoplasm of breast: Secondary | ICD-10-CM | POA: Diagnosis not present

## 2013-10-30 DIAGNOSIS — Z09 Encounter for follow-up examination after completed treatment for conditions other than malignant neoplasm: Secondary | ICD-10-CM | POA: Diagnosis not present

## 2013-11-01 DIAGNOSIS — N390 Urinary tract infection, site not specified: Secondary | ICD-10-CM | POA: Diagnosis not present

## 2013-11-05 DIAGNOSIS — R1032 Left lower quadrant pain: Secondary | ICD-10-CM | POA: Diagnosis not present

## 2013-11-05 DIAGNOSIS — I10 Essential (primary) hypertension: Secondary | ICD-10-CM | POA: Diagnosis not present

## 2013-11-05 DIAGNOSIS — N12 Tubulo-interstitial nephritis, not specified as acute or chronic: Secondary | ICD-10-CM | POA: Diagnosis not present

## 2013-11-19 DIAGNOSIS — R609 Edema, unspecified: Secondary | ICD-10-CM | POA: Diagnosis not present

## 2013-11-19 DIAGNOSIS — K5730 Diverticulosis of large intestine without perforation or abscess without bleeding: Secondary | ICD-10-CM | POA: Diagnosis not present

## 2013-11-19 DIAGNOSIS — Z79899 Other long term (current) drug therapy: Secondary | ICD-10-CM | POA: Diagnosis not present

## 2013-11-19 DIAGNOSIS — M2550 Pain in unspecified joint: Secondary | ICD-10-CM | POA: Diagnosis not present

## 2013-11-19 DIAGNOSIS — R7989 Other specified abnormal findings of blood chemistry: Secondary | ICD-10-CM | POA: Diagnosis not present

## 2013-11-19 DIAGNOSIS — I10 Essential (primary) hypertension: Secondary | ICD-10-CM | POA: Diagnosis not present

## 2013-11-19 DIAGNOSIS — E785 Hyperlipidemia, unspecified: Secondary | ICD-10-CM | POA: Diagnosis not present

## 2013-11-19 DIAGNOSIS — R946 Abnormal results of thyroid function studies: Secondary | ICD-10-CM | POA: Diagnosis not present

## 2013-11-24 DIAGNOSIS — Z23 Encounter for immunization: Secondary | ICD-10-CM | POA: Diagnosis not present

## 2013-12-19 DIAGNOSIS — D509 Iron deficiency anemia, unspecified: Secondary | ICD-10-CM | POA: Diagnosis not present

## 2013-12-19 DIAGNOSIS — G603 Idiopathic progressive neuropathy: Secondary | ICD-10-CM | POA: Diagnosis not present

## 2013-12-20 DIAGNOSIS — G894 Chronic pain syndrome: Secondary | ICD-10-CM | POA: Diagnosis not present

## 2013-12-20 DIAGNOSIS — G4700 Insomnia, unspecified: Secondary | ICD-10-CM | POA: Diagnosis not present

## 2013-12-20 DIAGNOSIS — G44221 Chronic tension-type headache, intractable: Secondary | ICD-10-CM | POA: Diagnosis not present

## 2013-12-20 DIAGNOSIS — G603 Idiopathic progressive neuropathy: Secondary | ICD-10-CM | POA: Diagnosis not present

## 2014-01-02 DIAGNOSIS — Z853 Personal history of malignant neoplasm of breast: Secondary | ICD-10-CM | POA: Diagnosis not present

## 2014-01-02 DIAGNOSIS — M5136 Other intervertebral disc degeneration, lumbar region: Secondary | ICD-10-CM | POA: Diagnosis not present

## 2014-01-02 DIAGNOSIS — Z9181 History of falling: Secondary | ICD-10-CM | POA: Diagnosis not present

## 2014-01-02 DIAGNOSIS — G603 Idiopathic progressive neuropathy: Secondary | ICD-10-CM | POA: Diagnosis not present

## 2014-01-02 DIAGNOSIS — I10 Essential (primary) hypertension: Secondary | ICD-10-CM | POA: Diagnosis not present

## 2014-01-02 DIAGNOSIS — D509 Iron deficiency anemia, unspecified: Secondary | ICD-10-CM | POA: Diagnosis not present

## 2014-01-03 DIAGNOSIS — I639 Cerebral infarction, unspecified: Secondary | ICD-10-CM | POA: Diagnosis not present

## 2014-01-03 DIAGNOSIS — M25572 Pain in left ankle and joints of left foot: Secondary | ICD-10-CM | POA: Diagnosis present

## 2014-01-03 DIAGNOSIS — Z888 Allergy status to other drugs, medicaments and biological substances status: Secondary | ICD-10-CM | POA: Diagnosis not present

## 2014-01-03 DIAGNOSIS — M549 Dorsalgia, unspecified: Secondary | ICD-10-CM | POA: Diagnosis not present

## 2014-01-03 DIAGNOSIS — J45909 Unspecified asthma, uncomplicated: Secondary | ICD-10-CM | POA: Diagnosis present

## 2014-01-03 DIAGNOSIS — R05 Cough: Secondary | ICD-10-CM | POA: Diagnosis not present

## 2014-01-03 DIAGNOSIS — I351 Nonrheumatic aortic (valve) insufficiency: Secondary | ICD-10-CM | POA: Diagnosis not present

## 2014-01-03 DIAGNOSIS — R0789 Other chest pain: Secondary | ICD-10-CM | POA: Diagnosis not present

## 2014-01-03 DIAGNOSIS — R531 Weakness: Secondary | ICD-10-CM | POA: Diagnosis not present

## 2014-01-03 DIAGNOSIS — I6523 Occlusion and stenosis of bilateral carotid arteries: Secondary | ICD-10-CM | POA: Diagnosis not present

## 2014-01-03 DIAGNOSIS — G452 Multiple and bilateral precerebral artery syndromes: Secondary | ICD-10-CM | POA: Diagnosis present

## 2014-01-03 DIAGNOSIS — I350 Nonrheumatic aortic (valve) stenosis: Secondary | ICD-10-CM | POA: Diagnosis present

## 2014-01-03 DIAGNOSIS — R296 Repeated falls: Secondary | ICD-10-CM | POA: Diagnosis not present

## 2014-01-03 DIAGNOSIS — I10 Essential (primary) hypertension: Secondary | ICD-10-CM | POA: Diagnosis not present

## 2014-01-03 DIAGNOSIS — S0990XA Unspecified injury of head, initial encounter: Secondary | ICD-10-CM | POA: Diagnosis not present

## 2014-01-03 DIAGNOSIS — M25571 Pain in right ankle and joints of right foot: Secondary | ICD-10-CM | POA: Diagnosis present

## 2014-01-03 DIAGNOSIS — R079 Chest pain, unspecified: Secondary | ICD-10-CM | POA: Diagnosis not present

## 2014-01-03 DIAGNOSIS — Z79899 Other long term (current) drug therapy: Secondary | ICD-10-CM | POA: Diagnosis not present

## 2014-01-03 DIAGNOSIS — R270 Ataxia, unspecified: Secondary | ICD-10-CM | POA: Diagnosis not present

## 2014-01-03 DIAGNOSIS — R2681 Unsteadiness on feet: Secondary | ICD-10-CM | POA: Diagnosis not present

## 2014-01-03 DIAGNOSIS — R062 Wheezing: Secondary | ICD-10-CM | POA: Diagnosis not present

## 2014-01-03 DIAGNOSIS — R936 Abnormal findings on diagnostic imaging of limbs: Secondary | ICD-10-CM | POA: Diagnosis not present

## 2014-01-03 DIAGNOSIS — W1839XA Other fall on same level, initial encounter: Secondary | ICD-10-CM | POA: Diagnosis not present

## 2014-01-03 DIAGNOSIS — K219 Gastro-esophageal reflux disease without esophagitis: Secondary | ICD-10-CM | POA: Diagnosis present

## 2014-01-03 DIAGNOSIS — R4781 Slurred speech: Secondary | ICD-10-CM | POA: Diagnosis not present

## 2014-01-03 DIAGNOSIS — I6340 Cerebral infarction due to embolism of unspecified cerebral artery: Secondary | ICD-10-CM | POA: Diagnosis not present

## 2014-01-03 DIAGNOSIS — M7989 Other specified soft tissue disorders: Secondary | ICD-10-CM | POA: Diagnosis not present

## 2014-01-03 DIAGNOSIS — S3992XA Unspecified injury of lower back, initial encounter: Secondary | ICD-10-CM | POA: Diagnosis not present

## 2014-01-03 DIAGNOSIS — Z853 Personal history of malignant neoplasm of breast: Secondary | ICD-10-CM | POA: Diagnosis not present

## 2014-01-03 DIAGNOSIS — R55 Syncope and collapse: Secondary | ICD-10-CM | POA: Diagnosis not present

## 2014-01-03 DIAGNOSIS — E860 Dehydration: Secondary | ICD-10-CM | POA: Diagnosis present

## 2014-01-03 DIAGNOSIS — G459 Transient cerebral ischemic attack, unspecified: Secondary | ICD-10-CM | POA: Diagnosis not present

## 2014-01-03 DIAGNOSIS — R262 Difficulty in walking, not elsewhere classified: Secondary | ICD-10-CM | POA: Diagnosis not present

## 2014-01-03 DIAGNOSIS — M25551 Pain in right hip: Secondary | ICD-10-CM | POA: Diagnosis not present

## 2014-01-03 DIAGNOSIS — R630 Anorexia: Secondary | ICD-10-CM | POA: Diagnosis not present

## 2014-01-09 DIAGNOSIS — Z9181 History of falling: Secondary | ICD-10-CM | POA: Diagnosis not present

## 2014-01-09 DIAGNOSIS — I10 Essential (primary) hypertension: Secondary | ICD-10-CM | POA: Diagnosis not present

## 2014-01-09 DIAGNOSIS — D509 Iron deficiency anemia, unspecified: Secondary | ICD-10-CM | POA: Diagnosis not present

## 2014-01-09 DIAGNOSIS — G603 Idiopathic progressive neuropathy: Secondary | ICD-10-CM | POA: Diagnosis not present

## 2014-01-09 DIAGNOSIS — Z853 Personal history of malignant neoplasm of breast: Secondary | ICD-10-CM | POA: Diagnosis not present

## 2014-01-09 DIAGNOSIS — M5136 Other intervertebral disc degeneration, lumbar region: Secondary | ICD-10-CM | POA: Diagnosis not present

## 2014-01-10 DIAGNOSIS — M5136 Other intervertebral disc degeneration, lumbar region: Secondary | ICD-10-CM | POA: Diagnosis not present

## 2014-01-10 DIAGNOSIS — I10 Essential (primary) hypertension: Secondary | ICD-10-CM | POA: Diagnosis not present

## 2014-01-10 DIAGNOSIS — Z853 Personal history of malignant neoplasm of breast: Secondary | ICD-10-CM | POA: Diagnosis not present

## 2014-01-10 DIAGNOSIS — G603 Idiopathic progressive neuropathy: Secondary | ICD-10-CM | POA: Diagnosis not present

## 2014-01-10 DIAGNOSIS — D509 Iron deficiency anemia, unspecified: Secondary | ICD-10-CM | POA: Diagnosis not present

## 2014-01-10 DIAGNOSIS — Z9181 History of falling: Secondary | ICD-10-CM | POA: Diagnosis not present

## 2014-01-15 DIAGNOSIS — M5136 Other intervertebral disc degeneration, lumbar region: Secondary | ICD-10-CM | POA: Diagnosis not present

## 2014-01-15 DIAGNOSIS — I10 Essential (primary) hypertension: Secondary | ICD-10-CM | POA: Diagnosis not present

## 2014-01-15 DIAGNOSIS — Z853 Personal history of malignant neoplasm of breast: Secondary | ICD-10-CM | POA: Diagnosis not present

## 2014-01-15 DIAGNOSIS — G603 Idiopathic progressive neuropathy: Secondary | ICD-10-CM | POA: Diagnosis not present

## 2014-01-15 DIAGNOSIS — D509 Iron deficiency anemia, unspecified: Secondary | ICD-10-CM | POA: Diagnosis not present

## 2014-01-15 DIAGNOSIS — Z9181 History of falling: Secondary | ICD-10-CM | POA: Diagnosis not present

## 2014-01-16 DIAGNOSIS — Z853 Personal history of malignant neoplasm of breast: Secondary | ICD-10-CM | POA: Diagnosis not present

## 2014-01-16 DIAGNOSIS — G603 Idiopathic progressive neuropathy: Secondary | ICD-10-CM | POA: Diagnosis not present

## 2014-01-16 DIAGNOSIS — D509 Iron deficiency anemia, unspecified: Secondary | ICD-10-CM | POA: Diagnosis not present

## 2014-01-16 DIAGNOSIS — M5136 Other intervertebral disc degeneration, lumbar region: Secondary | ICD-10-CM | POA: Diagnosis not present

## 2014-01-16 DIAGNOSIS — Z9181 History of falling: Secondary | ICD-10-CM | POA: Diagnosis not present

## 2014-01-16 DIAGNOSIS — I10 Essential (primary) hypertension: Secondary | ICD-10-CM | POA: Diagnosis not present

## 2014-01-17 DIAGNOSIS — D509 Iron deficiency anemia, unspecified: Secondary | ICD-10-CM | POA: Diagnosis not present

## 2014-01-17 DIAGNOSIS — Z9181 History of falling: Secondary | ICD-10-CM | POA: Diagnosis not present

## 2014-01-17 DIAGNOSIS — G603 Idiopathic progressive neuropathy: Secondary | ICD-10-CM | POA: Diagnosis not present

## 2014-01-17 DIAGNOSIS — I10 Essential (primary) hypertension: Secondary | ICD-10-CM | POA: Diagnosis not present

## 2014-01-17 DIAGNOSIS — M5136 Other intervertebral disc degeneration, lumbar region: Secondary | ICD-10-CM | POA: Diagnosis not present

## 2014-01-17 DIAGNOSIS — Z853 Personal history of malignant neoplasm of breast: Secondary | ICD-10-CM | POA: Diagnosis not present

## 2014-01-18 DIAGNOSIS — D509 Iron deficiency anemia, unspecified: Secondary | ICD-10-CM | POA: Diagnosis not present

## 2014-01-18 DIAGNOSIS — I10 Essential (primary) hypertension: Secondary | ICD-10-CM | POA: Diagnosis not present

## 2014-01-18 DIAGNOSIS — Z853 Personal history of malignant neoplasm of breast: Secondary | ICD-10-CM | POA: Diagnosis not present

## 2014-01-18 DIAGNOSIS — G603 Idiopathic progressive neuropathy: Secondary | ICD-10-CM | POA: Diagnosis not present

## 2014-01-18 DIAGNOSIS — M5136 Other intervertebral disc degeneration, lumbar region: Secondary | ICD-10-CM | POA: Diagnosis not present

## 2014-01-18 DIAGNOSIS — Z9181 History of falling: Secondary | ICD-10-CM | POA: Diagnosis not present

## 2014-01-22 DIAGNOSIS — G4700 Insomnia, unspecified: Secondary | ICD-10-CM | POA: Diagnosis not present

## 2014-01-22 DIAGNOSIS — I10 Essential (primary) hypertension: Secondary | ICD-10-CM | POA: Diagnosis not present

## 2014-01-22 DIAGNOSIS — Z8673 Personal history of transient ischemic attack (TIA), and cerebral infarction without residual deficits: Secondary | ICD-10-CM | POA: Diagnosis not present

## 2014-01-22 DIAGNOSIS — J209 Acute bronchitis, unspecified: Secondary | ICD-10-CM | POA: Diagnosis not present

## 2014-01-22 DIAGNOSIS — G603 Idiopathic progressive neuropathy: Secondary | ICD-10-CM | POA: Diagnosis not present

## 2014-01-23 DIAGNOSIS — Z9181 History of falling: Secondary | ICD-10-CM | POA: Diagnosis not present

## 2014-01-23 DIAGNOSIS — Z853 Personal history of malignant neoplasm of breast: Secondary | ICD-10-CM | POA: Diagnosis not present

## 2014-01-23 DIAGNOSIS — M5136 Other intervertebral disc degeneration, lumbar region: Secondary | ICD-10-CM | POA: Diagnosis not present

## 2014-01-23 DIAGNOSIS — D509 Iron deficiency anemia, unspecified: Secondary | ICD-10-CM | POA: Diagnosis not present

## 2014-01-23 DIAGNOSIS — I10 Essential (primary) hypertension: Secondary | ICD-10-CM | POA: Diagnosis not present

## 2014-01-23 DIAGNOSIS — G603 Idiopathic progressive neuropathy: Secondary | ICD-10-CM | POA: Diagnosis not present

## 2014-01-25 DIAGNOSIS — Z853 Personal history of malignant neoplasm of breast: Secondary | ICD-10-CM | POA: Diagnosis not present

## 2014-01-25 DIAGNOSIS — M5136 Other intervertebral disc degeneration, lumbar region: Secondary | ICD-10-CM | POA: Diagnosis not present

## 2014-01-25 DIAGNOSIS — I10 Essential (primary) hypertension: Secondary | ICD-10-CM | POA: Diagnosis not present

## 2014-01-25 DIAGNOSIS — D509 Iron deficiency anemia, unspecified: Secondary | ICD-10-CM | POA: Diagnosis not present

## 2014-01-25 DIAGNOSIS — G603 Idiopathic progressive neuropathy: Secondary | ICD-10-CM | POA: Diagnosis not present

## 2014-01-25 DIAGNOSIS — Z9181 History of falling: Secondary | ICD-10-CM | POA: Diagnosis not present

## 2014-01-28 DIAGNOSIS — G603 Idiopathic progressive neuropathy: Secondary | ICD-10-CM | POA: Diagnosis not present

## 2014-01-28 DIAGNOSIS — I10 Essential (primary) hypertension: Secondary | ICD-10-CM | POA: Diagnosis not present

## 2014-01-28 DIAGNOSIS — D509 Iron deficiency anemia, unspecified: Secondary | ICD-10-CM | POA: Diagnosis not present

## 2014-01-28 DIAGNOSIS — Z853 Personal history of malignant neoplasm of breast: Secondary | ICD-10-CM | POA: Diagnosis not present

## 2014-01-28 DIAGNOSIS — M5136 Other intervertebral disc degeneration, lumbar region: Secondary | ICD-10-CM | POA: Diagnosis not present

## 2014-01-28 DIAGNOSIS — Z9181 History of falling: Secondary | ICD-10-CM | POA: Diagnosis not present

## 2014-01-30 DIAGNOSIS — Z853 Personal history of malignant neoplasm of breast: Secondary | ICD-10-CM | POA: Diagnosis not present

## 2014-01-30 DIAGNOSIS — M5136 Other intervertebral disc degeneration, lumbar region: Secondary | ICD-10-CM | POA: Diagnosis not present

## 2014-01-30 DIAGNOSIS — Z9181 History of falling: Secondary | ICD-10-CM | POA: Diagnosis not present

## 2014-01-30 DIAGNOSIS — G603 Idiopathic progressive neuropathy: Secondary | ICD-10-CM | POA: Diagnosis not present

## 2014-01-30 DIAGNOSIS — I10 Essential (primary) hypertension: Secondary | ICD-10-CM | POA: Diagnosis not present

## 2014-01-30 DIAGNOSIS — D509 Iron deficiency anemia, unspecified: Secondary | ICD-10-CM | POA: Diagnosis not present

## 2014-02-01 DIAGNOSIS — Z853 Personal history of malignant neoplasm of breast: Secondary | ICD-10-CM | POA: Diagnosis not present

## 2014-02-01 DIAGNOSIS — I10 Essential (primary) hypertension: Secondary | ICD-10-CM | POA: Diagnosis not present

## 2014-02-01 DIAGNOSIS — G603 Idiopathic progressive neuropathy: Secondary | ICD-10-CM | POA: Diagnosis not present

## 2014-02-01 DIAGNOSIS — Z9181 History of falling: Secondary | ICD-10-CM | POA: Diagnosis not present

## 2014-02-01 DIAGNOSIS — M5136 Other intervertebral disc degeneration, lumbar region: Secondary | ICD-10-CM | POA: Diagnosis not present

## 2014-02-01 DIAGNOSIS — D509 Iron deficiency anemia, unspecified: Secondary | ICD-10-CM | POA: Diagnosis not present

## 2014-02-05 DIAGNOSIS — Z9181 History of falling: Secondary | ICD-10-CM | POA: Diagnosis not present

## 2014-02-05 DIAGNOSIS — I10 Essential (primary) hypertension: Secondary | ICD-10-CM | POA: Diagnosis not present

## 2014-02-05 DIAGNOSIS — M5136 Other intervertebral disc degeneration, lumbar region: Secondary | ICD-10-CM | POA: Diagnosis not present

## 2014-02-05 DIAGNOSIS — G603 Idiopathic progressive neuropathy: Secondary | ICD-10-CM | POA: Diagnosis not present

## 2014-02-05 DIAGNOSIS — Z853 Personal history of malignant neoplasm of breast: Secondary | ICD-10-CM | POA: Diagnosis not present

## 2014-02-05 DIAGNOSIS — D509 Iron deficiency anemia, unspecified: Secondary | ICD-10-CM | POA: Diagnosis not present

## 2014-02-07 DIAGNOSIS — Z9181 History of falling: Secondary | ICD-10-CM | POA: Diagnosis not present

## 2014-02-07 DIAGNOSIS — D509 Iron deficiency anemia, unspecified: Secondary | ICD-10-CM | POA: Diagnosis not present

## 2014-02-07 DIAGNOSIS — Z853 Personal history of malignant neoplasm of breast: Secondary | ICD-10-CM | POA: Diagnosis not present

## 2014-02-07 DIAGNOSIS — G603 Idiopathic progressive neuropathy: Secondary | ICD-10-CM | POA: Diagnosis not present

## 2014-02-07 DIAGNOSIS — I10 Essential (primary) hypertension: Secondary | ICD-10-CM | POA: Diagnosis not present

## 2014-02-07 DIAGNOSIS — M5136 Other intervertebral disc degeneration, lumbar region: Secondary | ICD-10-CM | POA: Diagnosis not present

## 2014-02-11 DIAGNOSIS — D509 Iron deficiency anemia, unspecified: Secondary | ICD-10-CM | POA: Diagnosis not present

## 2014-02-11 DIAGNOSIS — M5136 Other intervertebral disc degeneration, lumbar region: Secondary | ICD-10-CM | POA: Diagnosis not present

## 2014-02-11 DIAGNOSIS — Z9181 History of falling: Secondary | ICD-10-CM | POA: Diagnosis not present

## 2014-02-11 DIAGNOSIS — I10 Essential (primary) hypertension: Secondary | ICD-10-CM | POA: Diagnosis not present

## 2014-02-11 DIAGNOSIS — G603 Idiopathic progressive neuropathy: Secondary | ICD-10-CM | POA: Diagnosis not present

## 2014-02-11 DIAGNOSIS — Z853 Personal history of malignant neoplasm of breast: Secondary | ICD-10-CM | POA: Diagnosis not present

## 2014-02-12 DIAGNOSIS — D509 Iron deficiency anemia, unspecified: Secondary | ICD-10-CM | POA: Diagnosis not present

## 2014-02-12 DIAGNOSIS — G603 Idiopathic progressive neuropathy: Secondary | ICD-10-CM | POA: Diagnosis not present

## 2014-02-12 DIAGNOSIS — M5136 Other intervertebral disc degeneration, lumbar region: Secondary | ICD-10-CM | POA: Diagnosis not present

## 2014-02-12 DIAGNOSIS — E785 Hyperlipidemia, unspecified: Secondary | ICD-10-CM | POA: Diagnosis not present

## 2014-02-12 DIAGNOSIS — I10 Essential (primary) hypertension: Secondary | ICD-10-CM | POA: Diagnosis not present

## 2014-02-12 DIAGNOSIS — R1032 Left lower quadrant pain: Secondary | ICD-10-CM | POA: Diagnosis not present

## 2014-02-12 DIAGNOSIS — G4700 Insomnia, unspecified: Secondary | ICD-10-CM | POA: Diagnosis not present

## 2014-02-13 DIAGNOSIS — G603 Idiopathic progressive neuropathy: Secondary | ICD-10-CM | POA: Diagnosis not present

## 2014-02-13 DIAGNOSIS — M5136 Other intervertebral disc degeneration, lumbar region: Secondary | ICD-10-CM | POA: Diagnosis not present

## 2014-02-13 DIAGNOSIS — D509 Iron deficiency anemia, unspecified: Secondary | ICD-10-CM | POA: Diagnosis not present

## 2014-02-13 DIAGNOSIS — Z853 Personal history of malignant neoplasm of breast: Secondary | ICD-10-CM | POA: Diagnosis not present

## 2014-02-13 DIAGNOSIS — I10 Essential (primary) hypertension: Secondary | ICD-10-CM | POA: Diagnosis not present

## 2014-02-13 DIAGNOSIS — Z9181 History of falling: Secondary | ICD-10-CM | POA: Diagnosis not present

## 2014-02-19 DIAGNOSIS — I10 Essential (primary) hypertension: Secondary | ICD-10-CM | POA: Diagnosis not present

## 2014-02-19 DIAGNOSIS — G4700 Insomnia, unspecified: Secondary | ICD-10-CM | POA: Diagnosis not present

## 2014-02-19 DIAGNOSIS — E785 Hyperlipidemia, unspecified: Secondary | ICD-10-CM | POA: Diagnosis not present

## 2014-02-19 DIAGNOSIS — R1032 Left lower quadrant pain: Secondary | ICD-10-CM | POA: Diagnosis not present

## 2014-02-20 DIAGNOSIS — K5730 Diverticulosis of large intestine without perforation or abscess without bleeding: Secondary | ICD-10-CM | POA: Diagnosis not present

## 2014-02-20 DIAGNOSIS — R1032 Left lower quadrant pain: Secondary | ICD-10-CM | POA: Diagnosis not present

## 2014-02-21 DIAGNOSIS — G603 Idiopathic progressive neuropathy: Secondary | ICD-10-CM | POA: Diagnosis not present

## 2014-02-21 DIAGNOSIS — I10 Essential (primary) hypertension: Secondary | ICD-10-CM | POA: Diagnosis not present

## 2014-02-21 DIAGNOSIS — M5136 Other intervertebral disc degeneration, lumbar region: Secondary | ICD-10-CM | POA: Diagnosis not present

## 2014-02-21 DIAGNOSIS — D509 Iron deficiency anemia, unspecified: Secondary | ICD-10-CM | POA: Diagnosis not present

## 2014-02-21 DIAGNOSIS — Z853 Personal history of malignant neoplasm of breast: Secondary | ICD-10-CM | POA: Diagnosis not present

## 2014-02-21 DIAGNOSIS — Z9181 History of falling: Secondary | ICD-10-CM | POA: Diagnosis not present

## 2014-02-22 DIAGNOSIS — Z9181 History of falling: Secondary | ICD-10-CM | POA: Diagnosis not present

## 2014-02-22 DIAGNOSIS — G603 Idiopathic progressive neuropathy: Secondary | ICD-10-CM | POA: Diagnosis not present

## 2014-02-22 DIAGNOSIS — M5136 Other intervertebral disc degeneration, lumbar region: Secondary | ICD-10-CM | POA: Diagnosis not present

## 2014-02-22 DIAGNOSIS — Z853 Personal history of malignant neoplasm of breast: Secondary | ICD-10-CM | POA: Diagnosis not present

## 2014-02-22 DIAGNOSIS — D509 Iron deficiency anemia, unspecified: Secondary | ICD-10-CM | POA: Diagnosis not present

## 2014-02-22 DIAGNOSIS — I10 Essential (primary) hypertension: Secondary | ICD-10-CM | POA: Diagnosis not present

## 2014-02-25 DIAGNOSIS — D509 Iron deficiency anemia, unspecified: Secondary | ICD-10-CM | POA: Diagnosis not present

## 2014-02-25 DIAGNOSIS — Z9181 History of falling: Secondary | ICD-10-CM | POA: Diagnosis not present

## 2014-02-25 DIAGNOSIS — I10 Essential (primary) hypertension: Secondary | ICD-10-CM | POA: Diagnosis not present

## 2014-02-25 DIAGNOSIS — G603 Idiopathic progressive neuropathy: Secondary | ICD-10-CM | POA: Diagnosis not present

## 2014-02-25 DIAGNOSIS — Z853 Personal history of malignant neoplasm of breast: Secondary | ICD-10-CM | POA: Diagnosis not present

## 2014-02-25 DIAGNOSIS — M5136 Other intervertebral disc degeneration, lumbar region: Secondary | ICD-10-CM | POA: Diagnosis not present

## 2014-02-26 DIAGNOSIS — D509 Iron deficiency anemia, unspecified: Secondary | ICD-10-CM | POA: Diagnosis not present

## 2014-02-26 DIAGNOSIS — Z853 Personal history of malignant neoplasm of breast: Secondary | ICD-10-CM | POA: Diagnosis not present

## 2014-02-26 DIAGNOSIS — Z9181 History of falling: Secondary | ICD-10-CM | POA: Diagnosis not present

## 2014-02-26 DIAGNOSIS — G603 Idiopathic progressive neuropathy: Secondary | ICD-10-CM | POA: Diagnosis not present

## 2014-02-26 DIAGNOSIS — I10 Essential (primary) hypertension: Secondary | ICD-10-CM | POA: Diagnosis not present

## 2014-02-26 DIAGNOSIS — M5136 Other intervertebral disc degeneration, lumbar region: Secondary | ICD-10-CM | POA: Diagnosis not present

## 2014-02-27 DIAGNOSIS — N390 Urinary tract infection, site not specified: Secondary | ICD-10-CM | POA: Diagnosis not present

## 2014-03-02 DIAGNOSIS — I10 Essential (primary) hypertension: Secondary | ICD-10-CM | POA: Diagnosis not present

## 2014-03-02 DIAGNOSIS — M5136 Other intervertebral disc degeneration, lumbar region: Secondary | ICD-10-CM | POA: Diagnosis not present

## 2014-03-02 DIAGNOSIS — G603 Idiopathic progressive neuropathy: Secondary | ICD-10-CM | POA: Diagnosis not present

## 2014-03-02 DIAGNOSIS — Z9181 History of falling: Secondary | ICD-10-CM | POA: Diagnosis not present

## 2014-03-02 DIAGNOSIS — Z853 Personal history of malignant neoplasm of breast: Secondary | ICD-10-CM | POA: Diagnosis not present

## 2014-03-02 DIAGNOSIS — D509 Iron deficiency anemia, unspecified: Secondary | ICD-10-CM | POA: Diagnosis not present

## 2014-03-03 DIAGNOSIS — Z853 Personal history of malignant neoplasm of breast: Secondary | ICD-10-CM | POA: Diagnosis not present

## 2014-03-03 DIAGNOSIS — M5136 Other intervertebral disc degeneration, lumbar region: Secondary | ICD-10-CM | POA: Diagnosis not present

## 2014-03-03 DIAGNOSIS — D509 Iron deficiency anemia, unspecified: Secondary | ICD-10-CM | POA: Diagnosis not present

## 2014-03-03 DIAGNOSIS — G609 Hereditary and idiopathic neuropathy, unspecified: Secondary | ICD-10-CM | POA: Diagnosis not present

## 2014-03-03 DIAGNOSIS — I10 Essential (primary) hypertension: Secondary | ICD-10-CM | POA: Diagnosis not present

## 2014-03-03 DIAGNOSIS — Z9181 History of falling: Secondary | ICD-10-CM | POA: Diagnosis not present

## 2014-03-05 DIAGNOSIS — G609 Hereditary and idiopathic neuropathy, unspecified: Secondary | ICD-10-CM | POA: Diagnosis not present

## 2014-03-05 DIAGNOSIS — Z9181 History of falling: Secondary | ICD-10-CM | POA: Diagnosis not present

## 2014-03-05 DIAGNOSIS — I10 Essential (primary) hypertension: Secondary | ICD-10-CM | POA: Diagnosis not present

## 2014-03-05 DIAGNOSIS — Z853 Personal history of malignant neoplasm of breast: Secondary | ICD-10-CM | POA: Diagnosis not present

## 2014-03-05 DIAGNOSIS — M5136 Other intervertebral disc degeneration, lumbar region: Secondary | ICD-10-CM | POA: Diagnosis not present

## 2014-03-05 DIAGNOSIS — D509 Iron deficiency anemia, unspecified: Secondary | ICD-10-CM | POA: Diagnosis not present

## 2014-03-06 DIAGNOSIS — M545 Low back pain: Secondary | ICD-10-CM | POA: Diagnosis not present

## 2014-03-07 DIAGNOSIS — D509 Iron deficiency anemia, unspecified: Secondary | ICD-10-CM | POA: Diagnosis not present

## 2014-03-07 DIAGNOSIS — M5136 Other intervertebral disc degeneration, lumbar region: Secondary | ICD-10-CM | POA: Diagnosis not present

## 2014-03-07 DIAGNOSIS — G609 Hereditary and idiopathic neuropathy, unspecified: Secondary | ICD-10-CM | POA: Diagnosis not present

## 2014-03-07 DIAGNOSIS — Z853 Personal history of malignant neoplasm of breast: Secondary | ICD-10-CM | POA: Diagnosis not present

## 2014-03-07 DIAGNOSIS — Z9181 History of falling: Secondary | ICD-10-CM | POA: Diagnosis not present

## 2014-03-07 DIAGNOSIS — I10 Essential (primary) hypertension: Secondary | ICD-10-CM | POA: Diagnosis not present

## 2014-03-11 DIAGNOSIS — Z9181 History of falling: Secondary | ICD-10-CM | POA: Diagnosis not present

## 2014-03-11 DIAGNOSIS — Z853 Personal history of malignant neoplasm of breast: Secondary | ICD-10-CM | POA: Diagnosis not present

## 2014-03-11 DIAGNOSIS — M5136 Other intervertebral disc degeneration, lumbar region: Secondary | ICD-10-CM | POA: Diagnosis not present

## 2014-03-11 DIAGNOSIS — D509 Iron deficiency anemia, unspecified: Secondary | ICD-10-CM | POA: Diagnosis not present

## 2014-03-11 DIAGNOSIS — G609 Hereditary and idiopathic neuropathy, unspecified: Secondary | ICD-10-CM | POA: Diagnosis not present

## 2014-03-11 DIAGNOSIS — I10 Essential (primary) hypertension: Secondary | ICD-10-CM | POA: Diagnosis not present

## 2014-03-13 DIAGNOSIS — G609 Hereditary and idiopathic neuropathy, unspecified: Secondary | ICD-10-CM | POA: Diagnosis not present

## 2014-03-13 DIAGNOSIS — M5136 Other intervertebral disc degeneration, lumbar region: Secondary | ICD-10-CM | POA: Diagnosis not present

## 2014-03-13 DIAGNOSIS — Z853 Personal history of malignant neoplasm of breast: Secondary | ICD-10-CM | POA: Diagnosis not present

## 2014-03-13 DIAGNOSIS — I10 Essential (primary) hypertension: Secondary | ICD-10-CM | POA: Diagnosis not present

## 2014-03-13 DIAGNOSIS — D509 Iron deficiency anemia, unspecified: Secondary | ICD-10-CM | POA: Diagnosis not present

## 2014-03-13 DIAGNOSIS — Z9181 History of falling: Secondary | ICD-10-CM | POA: Diagnosis not present

## 2014-03-15 DIAGNOSIS — Z853 Personal history of malignant neoplasm of breast: Secondary | ICD-10-CM | POA: Diagnosis not present

## 2014-03-15 DIAGNOSIS — G609 Hereditary and idiopathic neuropathy, unspecified: Secondary | ICD-10-CM | POA: Diagnosis not present

## 2014-03-15 DIAGNOSIS — Z9181 History of falling: Secondary | ICD-10-CM | POA: Diagnosis not present

## 2014-03-15 DIAGNOSIS — D509 Iron deficiency anemia, unspecified: Secondary | ICD-10-CM | POA: Diagnosis not present

## 2014-03-15 DIAGNOSIS — I10 Essential (primary) hypertension: Secondary | ICD-10-CM | POA: Diagnosis not present

## 2014-03-15 DIAGNOSIS — M5136 Other intervertebral disc degeneration, lumbar region: Secondary | ICD-10-CM | POA: Diagnosis not present

## 2014-03-18 DIAGNOSIS — M549 Dorsalgia, unspecified: Secondary | ICD-10-CM | POA: Diagnosis not present

## 2014-03-18 DIAGNOSIS — I10 Essential (primary) hypertension: Secondary | ICD-10-CM | POA: Diagnosis not present

## 2014-03-18 DIAGNOSIS — G603 Idiopathic progressive neuropathy: Secondary | ICD-10-CM | POA: Diagnosis not present

## 2014-03-18 DIAGNOSIS — D509 Iron deficiency anemia, unspecified: Secondary | ICD-10-CM | POA: Diagnosis not present

## 2014-03-18 DIAGNOSIS — R2681 Unsteadiness on feet: Secondary | ICD-10-CM | POA: Diagnosis not present

## 2014-03-18 DIAGNOSIS — E785 Hyperlipidemia, unspecified: Secondary | ICD-10-CM | POA: Diagnosis not present

## 2014-03-18 DIAGNOSIS — M5136 Other intervertebral disc degeneration, lumbar region: Secondary | ICD-10-CM | POA: Diagnosis not present

## 2014-03-20 DIAGNOSIS — M5136 Other intervertebral disc degeneration, lumbar region: Secondary | ICD-10-CM | POA: Diagnosis not present

## 2014-03-20 DIAGNOSIS — D509 Iron deficiency anemia, unspecified: Secondary | ICD-10-CM | POA: Diagnosis not present

## 2014-03-20 DIAGNOSIS — G609 Hereditary and idiopathic neuropathy, unspecified: Secondary | ICD-10-CM | POA: Diagnosis not present

## 2014-03-20 DIAGNOSIS — I10 Essential (primary) hypertension: Secondary | ICD-10-CM | POA: Diagnosis not present

## 2014-03-20 DIAGNOSIS — Z853 Personal history of malignant neoplasm of breast: Secondary | ICD-10-CM | POA: Diagnosis not present

## 2014-03-20 DIAGNOSIS — Z9181 History of falling: Secondary | ICD-10-CM | POA: Diagnosis not present

## 2014-03-21 DIAGNOSIS — E785 Hyperlipidemia, unspecified: Secondary | ICD-10-CM | POA: Diagnosis not present

## 2014-03-21 DIAGNOSIS — R2681 Unsteadiness on feet: Secondary | ICD-10-CM | POA: Diagnosis not present

## 2014-03-21 DIAGNOSIS — I10 Essential (primary) hypertension: Secondary | ICD-10-CM | POA: Diagnosis not present

## 2014-03-21 DIAGNOSIS — G603 Idiopathic progressive neuropathy: Secondary | ICD-10-CM | POA: Diagnosis not present

## 2014-03-21 DIAGNOSIS — M549 Dorsalgia, unspecified: Secondary | ICD-10-CM | POA: Diagnosis not present

## 2014-03-22 DIAGNOSIS — G609 Hereditary and idiopathic neuropathy, unspecified: Secondary | ICD-10-CM | POA: Diagnosis not present

## 2014-03-22 DIAGNOSIS — Z853 Personal history of malignant neoplasm of breast: Secondary | ICD-10-CM | POA: Diagnosis not present

## 2014-03-22 DIAGNOSIS — I10 Essential (primary) hypertension: Secondary | ICD-10-CM | POA: Diagnosis not present

## 2014-03-22 DIAGNOSIS — Z9181 History of falling: Secondary | ICD-10-CM | POA: Diagnosis not present

## 2014-03-22 DIAGNOSIS — M5136 Other intervertebral disc degeneration, lumbar region: Secondary | ICD-10-CM | POA: Diagnosis not present

## 2014-03-22 DIAGNOSIS — D509 Iron deficiency anemia, unspecified: Secondary | ICD-10-CM | POA: Diagnosis not present

## 2014-03-25 DIAGNOSIS — Z9181 History of falling: Secondary | ICD-10-CM | POA: Diagnosis not present

## 2014-03-25 DIAGNOSIS — G609 Hereditary and idiopathic neuropathy, unspecified: Secondary | ICD-10-CM | POA: Diagnosis not present

## 2014-03-25 DIAGNOSIS — D509 Iron deficiency anemia, unspecified: Secondary | ICD-10-CM | POA: Diagnosis not present

## 2014-03-25 DIAGNOSIS — M5136 Other intervertebral disc degeneration, lumbar region: Secondary | ICD-10-CM | POA: Diagnosis not present

## 2014-03-25 DIAGNOSIS — I10 Essential (primary) hypertension: Secondary | ICD-10-CM | POA: Diagnosis not present

## 2014-03-25 DIAGNOSIS — Z853 Personal history of malignant neoplasm of breast: Secondary | ICD-10-CM | POA: Diagnosis not present

## 2014-03-26 DIAGNOSIS — Z9181 History of falling: Secondary | ICD-10-CM | POA: Diagnosis not present

## 2014-03-26 DIAGNOSIS — I10 Essential (primary) hypertension: Secondary | ICD-10-CM | POA: Diagnosis not present

## 2014-03-26 DIAGNOSIS — N390 Urinary tract infection, site not specified: Secondary | ICD-10-CM | POA: Diagnosis not present

## 2014-03-26 DIAGNOSIS — M5136 Other intervertebral disc degeneration, lumbar region: Secondary | ICD-10-CM | POA: Diagnosis not present

## 2014-03-26 DIAGNOSIS — G609 Hereditary and idiopathic neuropathy, unspecified: Secondary | ICD-10-CM | POA: Diagnosis not present

## 2014-03-26 DIAGNOSIS — Z853 Personal history of malignant neoplasm of breast: Secondary | ICD-10-CM | POA: Diagnosis not present

## 2014-03-26 DIAGNOSIS — D509 Iron deficiency anemia, unspecified: Secondary | ICD-10-CM | POA: Diagnosis not present

## 2014-03-27 DIAGNOSIS — Z9181 History of falling: Secondary | ICD-10-CM | POA: Diagnosis not present

## 2014-03-27 DIAGNOSIS — Z853 Personal history of malignant neoplasm of breast: Secondary | ICD-10-CM | POA: Diagnosis not present

## 2014-03-27 DIAGNOSIS — D509 Iron deficiency anemia, unspecified: Secondary | ICD-10-CM | POA: Diagnosis not present

## 2014-03-27 DIAGNOSIS — I10 Essential (primary) hypertension: Secondary | ICD-10-CM | POA: Diagnosis not present

## 2014-03-27 DIAGNOSIS — M5136 Other intervertebral disc degeneration, lumbar region: Secondary | ICD-10-CM | POA: Diagnosis not present

## 2014-03-27 DIAGNOSIS — G609 Hereditary and idiopathic neuropathy, unspecified: Secondary | ICD-10-CM | POA: Diagnosis not present

## 2014-04-04 DIAGNOSIS — D509 Iron deficiency anemia, unspecified: Secondary | ICD-10-CM | POA: Diagnosis not present

## 2014-04-04 DIAGNOSIS — Z9181 History of falling: Secondary | ICD-10-CM | POA: Diagnosis not present

## 2014-04-04 DIAGNOSIS — G609 Hereditary and idiopathic neuropathy, unspecified: Secondary | ICD-10-CM | POA: Diagnosis not present

## 2014-04-04 DIAGNOSIS — I10 Essential (primary) hypertension: Secondary | ICD-10-CM | POA: Diagnosis not present

## 2014-04-04 DIAGNOSIS — M5136 Other intervertebral disc degeneration, lumbar region: Secondary | ICD-10-CM | POA: Diagnosis not present

## 2014-04-04 DIAGNOSIS — Z853 Personal history of malignant neoplasm of breast: Secondary | ICD-10-CM | POA: Diagnosis not present

## 2014-04-06 DIAGNOSIS — M79662 Pain in left lower leg: Secondary | ICD-10-CM | POA: Diagnosis not present

## 2014-04-06 DIAGNOSIS — Z9181 History of falling: Secondary | ICD-10-CM | POA: Diagnosis not present

## 2014-04-06 DIAGNOSIS — I6930 Unspecified sequelae of cerebral infarction: Secondary | ICD-10-CM | POA: Diagnosis not present

## 2014-04-06 DIAGNOSIS — D72829 Elevated white blood cell count, unspecified: Secondary | ICD-10-CM | POA: Diagnosis not present

## 2014-04-06 DIAGNOSIS — R51 Headache: Secondary | ICD-10-CM | POA: Diagnosis not present

## 2014-04-06 DIAGNOSIS — E876 Hypokalemia: Secondary | ICD-10-CM | POA: Diagnosis not present

## 2014-04-06 DIAGNOSIS — S199XXA Unspecified injury of neck, initial encounter: Secondary | ICD-10-CM | POA: Diagnosis not present

## 2014-04-06 DIAGNOSIS — M542 Cervicalgia: Secondary | ICD-10-CM | POA: Diagnosis not present

## 2014-04-06 DIAGNOSIS — Z79899 Other long term (current) drug therapy: Secondary | ICD-10-CM | POA: Diagnosis not present

## 2014-04-06 DIAGNOSIS — M25562 Pain in left knee: Secondary | ICD-10-CM | POA: Diagnosis not present

## 2014-04-06 DIAGNOSIS — S0990XA Unspecified injury of head, initial encounter: Secondary | ICD-10-CM | POA: Diagnosis not present

## 2014-04-06 DIAGNOSIS — R531 Weakness: Secondary | ICD-10-CM | POA: Diagnosis not present

## 2014-04-06 DIAGNOSIS — E860 Dehydration: Secondary | ICD-10-CM | POA: Diagnosis not present

## 2014-04-06 DIAGNOSIS — R2689 Other abnormalities of gait and mobility: Secondary | ICD-10-CM | POA: Diagnosis not present

## 2014-04-06 DIAGNOSIS — M549 Dorsalgia, unspecified: Secondary | ICD-10-CM | POA: Diagnosis not present

## 2014-04-06 DIAGNOSIS — R509 Fever, unspecified: Secondary | ICD-10-CM | POA: Diagnosis not present

## 2014-04-06 DIAGNOSIS — S299XXA Unspecified injury of thorax, initial encounter: Secondary | ICD-10-CM | POA: Diagnosis not present

## 2014-04-06 DIAGNOSIS — S8992XA Unspecified injury of left lower leg, initial encounter: Secondary | ICD-10-CM | POA: Diagnosis not present

## 2014-04-06 DIAGNOSIS — K5900 Constipation, unspecified: Secondary | ICD-10-CM | POA: Diagnosis not present

## 2014-04-06 DIAGNOSIS — N390 Urinary tract infection, site not specified: Secondary | ICD-10-CM | POA: Diagnosis not present

## 2014-04-06 DIAGNOSIS — W06XXXA Fall from bed, initial encounter: Secondary | ICD-10-CM | POA: Diagnosis not present

## 2014-04-06 DIAGNOSIS — M546 Pain in thoracic spine: Secondary | ICD-10-CM | POA: Diagnosis not present

## 2014-04-07 DIAGNOSIS — N390 Urinary tract infection, site not specified: Secondary | ICD-10-CM | POA: Diagnosis not present

## 2014-04-07 DIAGNOSIS — E876 Hypokalemia: Secondary | ICD-10-CM | POA: Diagnosis not present

## 2014-04-07 DIAGNOSIS — E860 Dehydration: Secondary | ICD-10-CM | POA: Diagnosis not present

## 2014-04-07 DIAGNOSIS — D72829 Elevated white blood cell count, unspecified: Secondary | ICD-10-CM | POA: Diagnosis not present

## 2014-04-08 DIAGNOSIS — D72829 Elevated white blood cell count, unspecified: Secondary | ICD-10-CM | POA: Diagnosis not present

## 2014-04-08 DIAGNOSIS — E860 Dehydration: Secondary | ICD-10-CM | POA: Diagnosis not present

## 2014-04-08 DIAGNOSIS — N390 Urinary tract infection, site not specified: Secondary | ICD-10-CM | POA: Diagnosis not present

## 2014-04-08 DIAGNOSIS — E876 Hypokalemia: Secondary | ICD-10-CM | POA: Diagnosis not present

## 2014-04-09 DIAGNOSIS — D72829 Elevated white blood cell count, unspecified: Secondary | ICD-10-CM | POA: Diagnosis not present

## 2014-04-09 DIAGNOSIS — E876 Hypokalemia: Secondary | ICD-10-CM | POA: Diagnosis not present

## 2014-04-09 DIAGNOSIS — N390 Urinary tract infection, site not specified: Secondary | ICD-10-CM | POA: Diagnosis not present

## 2014-04-09 DIAGNOSIS — E860 Dehydration: Secondary | ICD-10-CM | POA: Diagnosis not present

## 2014-04-10 DIAGNOSIS — D72829 Elevated white blood cell count, unspecified: Secondary | ICD-10-CM | POA: Diagnosis not present

## 2014-04-10 DIAGNOSIS — E876 Hypokalemia: Secondary | ICD-10-CM | POA: Diagnosis not present

## 2014-04-10 DIAGNOSIS — N390 Urinary tract infection, site not specified: Secondary | ICD-10-CM | POA: Diagnosis not present

## 2014-04-10 DIAGNOSIS — E860 Dehydration: Secondary | ICD-10-CM | POA: Diagnosis not present

## 2014-04-11 DIAGNOSIS — I69920 Aphasia following unspecified cerebrovascular disease: Secondary | ICD-10-CM | POA: Diagnosis not present

## 2014-04-11 DIAGNOSIS — G894 Chronic pain syndrome: Secondary | ICD-10-CM | POA: Diagnosis not present

## 2014-04-11 DIAGNOSIS — Z79899 Other long term (current) drug therapy: Secondary | ICD-10-CM | POA: Diagnosis not present

## 2014-04-11 DIAGNOSIS — I6931 Cognitive deficits following cerebral infarction: Secondary | ICD-10-CM | POA: Diagnosis not present

## 2014-04-11 DIAGNOSIS — E559 Vitamin D deficiency, unspecified: Secondary | ICD-10-CM | POA: Diagnosis not present

## 2014-04-11 DIAGNOSIS — R262 Difficulty in walking, not elsewhere classified: Secondary | ICD-10-CM | POA: Diagnosis not present

## 2014-04-11 DIAGNOSIS — M6281 Muscle weakness (generalized): Secondary | ICD-10-CM | POA: Diagnosis not present

## 2014-04-12 DIAGNOSIS — I6931 Cognitive deficits following cerebral infarction: Secondary | ICD-10-CM | POA: Diagnosis not present

## 2014-04-12 DIAGNOSIS — M6281 Muscle weakness (generalized): Secondary | ICD-10-CM | POA: Diagnosis not present

## 2014-04-12 DIAGNOSIS — R262 Difficulty in walking, not elsewhere classified: Secondary | ICD-10-CM | POA: Diagnosis not present

## 2014-04-12 DIAGNOSIS — I69920 Aphasia following unspecified cerebrovascular disease: Secondary | ICD-10-CM | POA: Diagnosis not present

## 2014-04-13 DIAGNOSIS — M6281 Muscle weakness (generalized): Secondary | ICD-10-CM | POA: Diagnosis not present

## 2014-04-13 DIAGNOSIS — M545 Low back pain: Secondary | ICD-10-CM | POA: Diagnosis not present

## 2014-04-13 DIAGNOSIS — G894 Chronic pain syndrome: Secondary | ICD-10-CM | POA: Diagnosis not present

## 2014-04-14 DIAGNOSIS — M6281 Muscle weakness (generalized): Secondary | ICD-10-CM | POA: Diagnosis not present

## 2014-04-14 DIAGNOSIS — R262 Difficulty in walking, not elsewhere classified: Secondary | ICD-10-CM | POA: Diagnosis not present

## 2014-04-14 DIAGNOSIS — I69920 Aphasia following unspecified cerebrovascular disease: Secondary | ICD-10-CM | POA: Diagnosis not present

## 2014-04-14 DIAGNOSIS — I6931 Cognitive deficits following cerebral infarction: Secondary | ICD-10-CM | POA: Diagnosis not present

## 2014-04-15 DIAGNOSIS — M6281 Muscle weakness (generalized): Secondary | ICD-10-CM | POA: Diagnosis not present

## 2014-04-15 DIAGNOSIS — R262 Difficulty in walking, not elsewhere classified: Secondary | ICD-10-CM | POA: Diagnosis not present

## 2014-04-15 DIAGNOSIS — I6931 Cognitive deficits following cerebral infarction: Secondary | ICD-10-CM | POA: Diagnosis not present

## 2014-04-15 DIAGNOSIS — I69920 Aphasia following unspecified cerebrovascular disease: Secondary | ICD-10-CM | POA: Diagnosis not present

## 2014-04-16 DIAGNOSIS — I69920 Aphasia following unspecified cerebrovascular disease: Secondary | ICD-10-CM | POA: Diagnosis not present

## 2014-04-16 DIAGNOSIS — R262 Difficulty in walking, not elsewhere classified: Secondary | ICD-10-CM | POA: Diagnosis not present

## 2014-04-16 DIAGNOSIS — M6281 Muscle weakness (generalized): Secondary | ICD-10-CM | POA: Diagnosis not present

## 2014-04-16 DIAGNOSIS — I6931 Cognitive deficits following cerebral infarction: Secondary | ICD-10-CM | POA: Diagnosis not present

## 2014-04-17 DIAGNOSIS — R262 Difficulty in walking, not elsewhere classified: Secondary | ICD-10-CM | POA: Diagnosis not present

## 2014-04-17 DIAGNOSIS — M6281 Muscle weakness (generalized): Secondary | ICD-10-CM | POA: Diagnosis not present

## 2014-04-17 DIAGNOSIS — I69920 Aphasia following unspecified cerebrovascular disease: Secondary | ICD-10-CM | POA: Diagnosis not present

## 2014-04-17 DIAGNOSIS — I6931 Cognitive deficits following cerebral infarction: Secondary | ICD-10-CM | POA: Diagnosis not present

## 2014-04-18 DIAGNOSIS — I69920 Aphasia following unspecified cerebrovascular disease: Secondary | ICD-10-CM | POA: Diagnosis not present

## 2014-04-18 DIAGNOSIS — R262 Difficulty in walking, not elsewhere classified: Secondary | ICD-10-CM | POA: Diagnosis not present

## 2014-04-18 DIAGNOSIS — I6931 Cognitive deficits following cerebral infarction: Secondary | ICD-10-CM | POA: Diagnosis not present

## 2014-04-18 DIAGNOSIS — M6281 Muscle weakness (generalized): Secondary | ICD-10-CM | POA: Diagnosis not present

## 2014-04-19 DIAGNOSIS — I6931 Cognitive deficits following cerebral infarction: Secondary | ICD-10-CM | POA: Diagnosis not present

## 2014-04-19 DIAGNOSIS — I69920 Aphasia following unspecified cerebrovascular disease: Secondary | ICD-10-CM | POA: Diagnosis not present

## 2014-04-19 DIAGNOSIS — M6281 Muscle weakness (generalized): Secondary | ICD-10-CM | POA: Diagnosis not present

## 2014-04-19 DIAGNOSIS — G894 Chronic pain syndrome: Secondary | ICD-10-CM | POA: Diagnosis not present

## 2014-04-19 DIAGNOSIS — R262 Difficulty in walking, not elsewhere classified: Secondary | ICD-10-CM | POA: Diagnosis not present

## 2014-04-21 DIAGNOSIS — M6281 Muscle weakness (generalized): Secondary | ICD-10-CM | POA: Diagnosis not present

## 2014-04-21 DIAGNOSIS — I6931 Cognitive deficits following cerebral infarction: Secondary | ICD-10-CM | POA: Diagnosis not present

## 2014-04-21 DIAGNOSIS — I69920 Aphasia following unspecified cerebrovascular disease: Secondary | ICD-10-CM | POA: Diagnosis not present

## 2014-04-21 DIAGNOSIS — R262 Difficulty in walking, not elsewhere classified: Secondary | ICD-10-CM | POA: Diagnosis not present

## 2014-04-23 DIAGNOSIS — R262 Difficulty in walking, not elsewhere classified: Secondary | ICD-10-CM | POA: Diagnosis not present

## 2014-04-23 DIAGNOSIS — M6281 Muscle weakness (generalized): Secondary | ICD-10-CM | POA: Diagnosis not present

## 2014-04-23 DIAGNOSIS — I6931 Cognitive deficits following cerebral infarction: Secondary | ICD-10-CM | POA: Diagnosis not present

## 2014-04-23 DIAGNOSIS — I69920 Aphasia following unspecified cerebrovascular disease: Secondary | ICD-10-CM | POA: Diagnosis not present

## 2014-04-24 DIAGNOSIS — M6281 Muscle weakness (generalized): Secondary | ICD-10-CM | POA: Diagnosis not present

## 2014-04-24 DIAGNOSIS — R262 Difficulty in walking, not elsewhere classified: Secondary | ICD-10-CM | POA: Diagnosis not present

## 2014-04-24 DIAGNOSIS — I6931 Cognitive deficits following cerebral infarction: Secondary | ICD-10-CM | POA: Diagnosis not present

## 2014-04-24 DIAGNOSIS — I69920 Aphasia following unspecified cerebrovascular disease: Secondary | ICD-10-CM | POA: Diagnosis not present

## 2014-04-25 DIAGNOSIS — M6281 Muscle weakness (generalized): Secondary | ICD-10-CM | POA: Diagnosis not present

## 2014-04-25 DIAGNOSIS — R262 Difficulty in walking, not elsewhere classified: Secondary | ICD-10-CM | POA: Diagnosis not present

## 2014-04-25 DIAGNOSIS — I69920 Aphasia following unspecified cerebrovascular disease: Secondary | ICD-10-CM | POA: Diagnosis not present

## 2014-04-25 DIAGNOSIS — I6931 Cognitive deficits following cerebral infarction: Secondary | ICD-10-CM | POA: Diagnosis not present

## 2014-04-26 DIAGNOSIS — M6281 Muscle weakness (generalized): Secondary | ICD-10-CM | POA: Diagnosis not present

## 2014-04-26 DIAGNOSIS — R262 Difficulty in walking, not elsewhere classified: Secondary | ICD-10-CM | POA: Diagnosis not present

## 2014-04-26 DIAGNOSIS — I6931 Cognitive deficits following cerebral infarction: Secondary | ICD-10-CM | POA: Diagnosis not present

## 2014-04-26 DIAGNOSIS — I69920 Aphasia following unspecified cerebrovascular disease: Secondary | ICD-10-CM | POA: Diagnosis not present

## 2014-04-27 DIAGNOSIS — N390 Urinary tract infection, site not specified: Secondary | ICD-10-CM | POA: Diagnosis not present

## 2014-04-29 DIAGNOSIS — M6281 Muscle weakness (generalized): Secondary | ICD-10-CM | POA: Diagnosis not present

## 2014-04-29 DIAGNOSIS — I69920 Aphasia following unspecified cerebrovascular disease: Secondary | ICD-10-CM | POA: Diagnosis not present

## 2014-04-29 DIAGNOSIS — I6931 Cognitive deficits following cerebral infarction: Secondary | ICD-10-CM | POA: Diagnosis not present

## 2014-04-29 DIAGNOSIS — R262 Difficulty in walking, not elsewhere classified: Secondary | ICD-10-CM | POA: Diagnosis not present

## 2014-04-30 DIAGNOSIS — I69920 Aphasia following unspecified cerebrovascular disease: Secondary | ICD-10-CM | POA: Diagnosis not present

## 2014-04-30 DIAGNOSIS — R262 Difficulty in walking, not elsewhere classified: Secondary | ICD-10-CM | POA: Diagnosis not present

## 2014-04-30 DIAGNOSIS — M6281 Muscle weakness (generalized): Secondary | ICD-10-CM | POA: Diagnosis not present

## 2014-04-30 DIAGNOSIS — I6931 Cognitive deficits following cerebral infarction: Secondary | ICD-10-CM | POA: Diagnosis not present

## 2014-05-01 DIAGNOSIS — R262 Difficulty in walking, not elsewhere classified: Secondary | ICD-10-CM | POA: Diagnosis not present

## 2014-05-01 DIAGNOSIS — N390 Urinary tract infection, site not specified: Secondary | ICD-10-CM | POA: Diagnosis not present

## 2014-05-01 DIAGNOSIS — M545 Low back pain: Secondary | ICD-10-CM | POA: Diagnosis not present

## 2014-05-01 DIAGNOSIS — I6931 Cognitive deficits following cerebral infarction: Secondary | ICD-10-CM | POA: Diagnosis not present

## 2014-05-01 DIAGNOSIS — M6281 Muscle weakness (generalized): Secondary | ICD-10-CM | POA: Diagnosis not present

## 2014-05-01 DIAGNOSIS — I69920 Aphasia following unspecified cerebrovascular disease: Secondary | ICD-10-CM | POA: Diagnosis not present

## 2014-05-02 DIAGNOSIS — M6281 Muscle weakness (generalized): Secondary | ICD-10-CM | POA: Diagnosis not present

## 2014-05-02 DIAGNOSIS — I69920 Aphasia following unspecified cerebrovascular disease: Secondary | ICD-10-CM | POA: Diagnosis not present

## 2014-05-02 DIAGNOSIS — R262 Difficulty in walking, not elsewhere classified: Secondary | ICD-10-CM | POA: Diagnosis not present

## 2014-05-02 DIAGNOSIS — I6931 Cognitive deficits following cerebral infarction: Secondary | ICD-10-CM | POA: Diagnosis not present

## 2014-05-03 DIAGNOSIS — R262 Difficulty in walking, not elsewhere classified: Secondary | ICD-10-CM | POA: Diagnosis not present

## 2014-05-03 DIAGNOSIS — I6931 Cognitive deficits following cerebral infarction: Secondary | ICD-10-CM | POA: Diagnosis not present

## 2014-05-03 DIAGNOSIS — I69920 Aphasia following unspecified cerebrovascular disease: Secondary | ICD-10-CM | POA: Diagnosis not present

## 2014-05-03 DIAGNOSIS — M6281 Muscle weakness (generalized): Secondary | ICD-10-CM | POA: Diagnosis not present

## 2014-05-09 DIAGNOSIS — E785 Hyperlipidemia, unspecified: Secondary | ICD-10-CM | POA: Diagnosis not present

## 2014-05-09 DIAGNOSIS — I10 Essential (primary) hypertension: Secondary | ICD-10-CM | POA: Diagnosis not present

## 2014-05-09 DIAGNOSIS — R600 Localized edema: Secondary | ICD-10-CM | POA: Diagnosis not present

## 2014-05-09 DIAGNOSIS — I6789 Other cerebrovascular disease: Secondary | ICD-10-CM | POA: Diagnosis not present

## 2014-05-20 DIAGNOSIS — L0390 Cellulitis, unspecified: Secondary | ICD-10-CM | POA: Diagnosis not present

## 2014-05-22 DIAGNOSIS — L0390 Cellulitis, unspecified: Secondary | ICD-10-CM | POA: Diagnosis not present

## 2014-05-22 DIAGNOSIS — E785 Hyperlipidemia, unspecified: Secondary | ICD-10-CM | POA: Diagnosis not present

## 2014-05-22 DIAGNOSIS — I10 Essential (primary) hypertension: Secondary | ICD-10-CM | POA: Diagnosis not present

## 2014-05-22 DIAGNOSIS — I6789 Other cerebrovascular disease: Secondary | ICD-10-CM | POA: Diagnosis not present

## 2014-06-05 DIAGNOSIS — M6250 Muscle wasting and atrophy, not elsewhere classified, unspecified site: Secondary | ICD-10-CM | POA: Diagnosis not present

## 2014-06-05 DIAGNOSIS — F419 Anxiety disorder, unspecified: Secondary | ICD-10-CM | POA: Diagnosis not present

## 2014-06-05 DIAGNOSIS — I6789 Other cerebrovascular disease: Secondary | ICD-10-CM | POA: Diagnosis not present

## 2014-06-05 DIAGNOSIS — L0390 Cellulitis, unspecified: Secondary | ICD-10-CM | POA: Diagnosis not present

## 2014-06-12 DIAGNOSIS — M1711 Unilateral primary osteoarthritis, right knee: Secondary | ICD-10-CM | POA: Diagnosis not present

## 2014-06-27 DIAGNOSIS — G4700 Insomnia, unspecified: Secondary | ICD-10-CM | POA: Diagnosis not present

## 2014-06-27 DIAGNOSIS — E785 Hyperlipidemia, unspecified: Secondary | ICD-10-CM | POA: Diagnosis not present

## 2014-06-27 DIAGNOSIS — Z8673 Personal history of transient ischemic attack (TIA), and cerebral infarction without residual deficits: Secondary | ICD-10-CM | POA: Diagnosis not present

## 2014-06-27 DIAGNOSIS — G603 Idiopathic progressive neuropathy: Secondary | ICD-10-CM | POA: Diagnosis not present

## 2014-06-27 DIAGNOSIS — I10 Essential (primary) hypertension: Secondary | ICD-10-CM | POA: Diagnosis not present

## 2014-06-27 DIAGNOSIS — M549 Dorsalgia, unspecified: Secondary | ICD-10-CM | POA: Diagnosis not present

## 2014-07-02 DIAGNOSIS — M545 Low back pain: Secondary | ICD-10-CM | POA: Diagnosis not present

## 2014-07-02 DIAGNOSIS — S22009A Unspecified fracture of unspecified thoracic vertebra, initial encounter for closed fracture: Secondary | ICD-10-CM | POA: Diagnosis not present

## 2014-07-08 DIAGNOSIS — S22069A Unspecified fracture of T7-T8 vertebra, initial encounter for closed fracture: Secondary | ICD-10-CM | POA: Diagnosis not present

## 2014-07-08 DIAGNOSIS — M4854XA Collapsed vertebra, not elsewhere classified, thoracic region, initial encounter for fracture: Secondary | ICD-10-CM | POA: Diagnosis not present

## 2014-07-08 DIAGNOSIS — S22079A Unspecified fracture of T9-T10 vertebra, initial encounter for closed fracture: Secondary | ICD-10-CM | POA: Diagnosis not present

## 2014-07-08 DIAGNOSIS — W182XXA Fall in (into) shower or empty bathtub, initial encounter: Secondary | ICD-10-CM | POA: Diagnosis not present

## 2014-07-08 DIAGNOSIS — S22089A Unspecified fracture of T11-T12 vertebra, initial encounter for closed fracture: Secondary | ICD-10-CM | POA: Diagnosis not present

## 2014-07-08 DIAGNOSIS — M545 Low back pain: Secondary | ICD-10-CM | POA: Diagnosis not present

## 2014-07-09 DIAGNOSIS — S22009A Unspecified fracture of unspecified thoracic vertebra, initial encounter for closed fracture: Secondary | ICD-10-CM | POA: Diagnosis not present

## 2014-07-10 DIAGNOSIS — Z170 Estrogen receptor positive status [ER+]: Secondary | ICD-10-CM | POA: Diagnosis not present

## 2014-07-10 DIAGNOSIS — Z853 Personal history of malignant neoplasm of breast: Secondary | ICD-10-CM | POA: Diagnosis not present

## 2014-07-10 DIAGNOSIS — Z79811 Long term (current) use of aromatase inhibitors: Secondary | ICD-10-CM | POA: Diagnosis not present

## 2014-07-11 DIAGNOSIS — Z79899 Other long term (current) drug therapy: Secondary | ICD-10-CM | POA: Diagnosis not present

## 2014-07-11 DIAGNOSIS — M8008XA Age-related osteoporosis with current pathological fracture, vertebra(e), initial encounter for fracture: Secondary | ICD-10-CM | POA: Diagnosis not present

## 2014-07-11 DIAGNOSIS — R269 Unspecified abnormalities of gait and mobility: Secondary | ICD-10-CM | POA: Diagnosis not present

## 2014-07-11 DIAGNOSIS — G629 Polyneuropathy, unspecified: Secondary | ICD-10-CM | POA: Diagnosis not present

## 2014-07-11 DIAGNOSIS — Z8673 Personal history of transient ischemic attack (TIA), and cerebral infarction without residual deficits: Secondary | ICD-10-CM | POA: Diagnosis not present

## 2014-07-11 DIAGNOSIS — S22009A Unspecified fracture of unspecified thoracic vertebra, initial encounter for closed fracture: Secondary | ICD-10-CM | POA: Diagnosis not present

## 2014-07-11 DIAGNOSIS — M4854XA Collapsed vertebra, not elsewhere classified, thoracic region, initial encounter for fracture: Secondary | ICD-10-CM | POA: Diagnosis not present

## 2014-07-11 DIAGNOSIS — I10 Essential (primary) hypertension: Secondary | ICD-10-CM | POA: Diagnosis not present

## 2014-07-11 DIAGNOSIS — Z9181 History of falling: Secondary | ICD-10-CM | POA: Diagnosis not present

## 2014-07-11 DIAGNOSIS — Z7902 Long term (current) use of antithrombotics/antiplatelets: Secondary | ICD-10-CM | POA: Diagnosis not present

## 2014-07-11 DIAGNOSIS — M810 Age-related osteoporosis without current pathological fracture: Secondary | ICD-10-CM | POA: Diagnosis not present

## 2014-07-11 DIAGNOSIS — E785 Hyperlipidemia, unspecified: Secondary | ICD-10-CM | POA: Diagnosis not present

## 2014-08-15 DIAGNOSIS — R922 Inconclusive mammogram: Secondary | ICD-10-CM | POA: Diagnosis not present

## 2014-08-15 DIAGNOSIS — C50419 Malignant neoplasm of upper-outer quadrant of unspecified female breast: Secondary | ICD-10-CM | POA: Diagnosis not present

## 2014-08-16 DIAGNOSIS — N3080 Other cystitis without hematuria: Secondary | ICD-10-CM | POA: Diagnosis not present

## 2014-08-16 DIAGNOSIS — R1031 Right lower quadrant pain: Secondary | ICD-10-CM | POA: Diagnosis not present

## 2014-08-16 DIAGNOSIS — N200 Calculus of kidney: Secondary | ICD-10-CM | POA: Diagnosis not present

## 2014-08-21 DIAGNOSIS — N200 Calculus of kidney: Secondary | ICD-10-CM | POA: Diagnosis not present

## 2014-08-21 DIAGNOSIS — R1031 Right lower quadrant pain: Secondary | ICD-10-CM | POA: Diagnosis not present

## 2014-08-21 DIAGNOSIS — I7090 Unspecified atherosclerosis: Secondary | ICD-10-CM | POA: Diagnosis not present

## 2014-08-21 DIAGNOSIS — Z853 Personal history of malignant neoplasm of breast: Secondary | ICD-10-CM | POA: Diagnosis not present

## 2014-08-21 DIAGNOSIS — N390 Urinary tract infection, site not specified: Secondary | ICD-10-CM | POA: Diagnosis not present

## 2014-09-02 DIAGNOSIS — N3080 Other cystitis without hematuria: Secondary | ICD-10-CM | POA: Diagnosis not present

## 2014-09-02 DIAGNOSIS — R1031 Right lower quadrant pain: Secondary | ICD-10-CM | POA: Diagnosis not present

## 2014-09-02 DIAGNOSIS — N200 Calculus of kidney: Secondary | ICD-10-CM | POA: Diagnosis not present

## 2014-09-10 DIAGNOSIS — C50412 Malignant neoplasm of upper-outer quadrant of left female breast: Secondary | ICD-10-CM | POA: Diagnosis not present

## 2014-09-10 DIAGNOSIS — Z6831 Body mass index (BMI) 31.0-31.9, adult: Secondary | ICD-10-CM | POA: Diagnosis not present

## 2014-09-10 DIAGNOSIS — C773 Secondary and unspecified malignant neoplasm of axilla and upper limb lymph nodes: Secondary | ICD-10-CM | POA: Diagnosis not present

## 2014-09-10 DIAGNOSIS — E669 Obesity, unspecified: Secondary | ICD-10-CM | POA: Diagnosis not present

## 2014-09-12 DIAGNOSIS — M25572 Pain in left ankle and joints of left foot: Secondary | ICD-10-CM | POA: Diagnosis not present

## 2014-09-12 DIAGNOSIS — L600 Ingrowing nail: Secondary | ICD-10-CM | POA: Diagnosis not present

## 2014-09-20 DIAGNOSIS — S22080A Wedge compression fracture of T11-T12 vertebra, initial encounter for closed fracture: Secondary | ICD-10-CM | POA: Diagnosis not present

## 2014-10-01 DIAGNOSIS — N3090 Cystitis, unspecified without hematuria: Secondary | ICD-10-CM | POA: Diagnosis not present

## 2014-10-01 DIAGNOSIS — R109 Unspecified abdominal pain: Secondary | ICD-10-CM | POA: Diagnosis not present

## 2014-10-08 DIAGNOSIS — L600 Ingrowing nail: Secondary | ICD-10-CM | POA: Diagnosis not present

## 2014-10-15 DIAGNOSIS — Z8673 Personal history of transient ischemic attack (TIA), and cerebral infarction without residual deficits: Secondary | ICD-10-CM | POA: Diagnosis not present

## 2014-10-15 DIAGNOSIS — Z79899 Other long term (current) drug therapy: Secondary | ICD-10-CM | POA: Diagnosis not present

## 2014-10-15 DIAGNOSIS — G4700 Insomnia, unspecified: Secondary | ICD-10-CM | POA: Diagnosis not present

## 2014-10-15 DIAGNOSIS — D509 Iron deficiency anemia, unspecified: Secondary | ICD-10-CM | POA: Diagnosis not present

## 2014-10-15 DIAGNOSIS — I10 Essential (primary) hypertension: Secondary | ICD-10-CM | POA: Diagnosis not present

## 2014-10-15 DIAGNOSIS — G603 Idiopathic progressive neuropathy: Secondary | ICD-10-CM | POA: Diagnosis not present

## 2014-10-15 DIAGNOSIS — E785 Hyperlipidemia, unspecified: Secondary | ICD-10-CM | POA: Diagnosis not present

## 2014-10-22 DIAGNOSIS — L03031 Cellulitis of right toe: Secondary | ICD-10-CM | POA: Diagnosis not present

## 2014-10-29 DIAGNOSIS — L03031 Cellulitis of right toe: Secondary | ICD-10-CM | POA: Diagnosis not present

## 2014-12-17 DIAGNOSIS — M5137 Other intervertebral disc degeneration, lumbosacral region: Secondary | ICD-10-CM | POA: Diagnosis not present

## 2014-12-17 DIAGNOSIS — M546 Pain in thoracic spine: Secondary | ICD-10-CM | POA: Diagnosis not present

## 2014-12-17 DIAGNOSIS — M545 Low back pain: Secondary | ICD-10-CM | POA: Diagnosis not present

## 2014-12-17 DIAGNOSIS — M5440 Lumbago with sciatica, unspecified side: Secondary | ICD-10-CM | POA: Diagnosis not present

## 2014-12-17 DIAGNOSIS — G8929 Other chronic pain: Secondary | ICD-10-CM | POA: Diagnosis not present

## 2015-01-10 DIAGNOSIS — C50412 Malignant neoplasm of upper-outer quadrant of left female breast: Secondary | ICD-10-CM

## 2015-01-10 DIAGNOSIS — Z79811 Long term (current) use of aromatase inhibitors: Secondary | ICD-10-CM | POA: Diagnosis not present

## 2015-01-10 DIAGNOSIS — Z853 Personal history of malignant neoplasm of breast: Secondary | ICD-10-CM | POA: Diagnosis not present

## 2015-01-23 DIAGNOSIS — I10 Essential (primary) hypertension: Secondary | ICD-10-CM | POA: Diagnosis not present

## 2015-01-23 DIAGNOSIS — R630 Anorexia: Secondary | ICD-10-CM | POA: Diagnosis not present

## 2015-01-23 DIAGNOSIS — M549 Dorsalgia, unspecified: Secondary | ICD-10-CM | POA: Diagnosis not present

## 2015-01-23 DIAGNOSIS — G603 Idiopathic progressive neuropathy: Secondary | ICD-10-CM | POA: Diagnosis not present

## 2015-01-25 DIAGNOSIS — Z23 Encounter for immunization: Secondary | ICD-10-CM | POA: Diagnosis not present

## 2015-02-18 DIAGNOSIS — M7989 Other specified soft tissue disorders: Secondary | ICD-10-CM | POA: Diagnosis not present

## 2015-02-18 DIAGNOSIS — I10 Essential (primary) hypertension: Secondary | ICD-10-CM | POA: Diagnosis not present

## 2015-02-18 DIAGNOSIS — E785 Hyperlipidemia, unspecified: Secondary | ICD-10-CM | POA: Diagnosis not present

## 2015-02-18 DIAGNOSIS — G4700 Insomnia, unspecified: Secondary | ICD-10-CM | POA: Diagnosis not present

## 2015-02-18 DIAGNOSIS — G603 Idiopathic progressive neuropathy: Secondary | ICD-10-CM | POA: Diagnosis not present

## 2015-02-18 DIAGNOSIS — M549 Dorsalgia, unspecified: Secondary | ICD-10-CM | POA: Diagnosis not present

## 2015-03-06 DIAGNOSIS — M1712 Unilateral primary osteoarthritis, left knee: Secondary | ICD-10-CM | POA: Diagnosis not present

## 2015-03-06 DIAGNOSIS — Z79899 Other long term (current) drug therapy: Secondary | ICD-10-CM | POA: Diagnosis not present

## 2015-03-06 DIAGNOSIS — M961 Postlaminectomy syndrome, not elsewhere classified: Secondary | ICD-10-CM | POA: Diagnosis not present

## 2015-03-06 DIAGNOSIS — M545 Low back pain: Secondary | ICD-10-CM | POA: Diagnosis not present

## 2015-03-06 DIAGNOSIS — G894 Chronic pain syndrome: Secondary | ICD-10-CM | POA: Diagnosis not present

## 2015-04-21 DIAGNOSIS — M810 Age-related osteoporosis without current pathological fracture: Secondary | ICD-10-CM | POA: Diagnosis not present

## 2015-04-21 DIAGNOSIS — G603 Idiopathic progressive neuropathy: Secondary | ICD-10-CM | POA: Diagnosis not present

## 2015-04-21 DIAGNOSIS — E785 Hyperlipidemia, unspecified: Secondary | ICD-10-CM | POA: Diagnosis not present

## 2015-04-21 DIAGNOSIS — D509 Iron deficiency anemia, unspecified: Secondary | ICD-10-CM | POA: Diagnosis not present

## 2015-04-21 DIAGNOSIS — M47816 Spondylosis without myelopathy or radiculopathy, lumbar region: Secondary | ICD-10-CM | POA: Diagnosis not present

## 2015-04-21 DIAGNOSIS — Z79899 Other long term (current) drug therapy: Secondary | ICD-10-CM | POA: Diagnosis not present

## 2015-04-21 DIAGNOSIS — I10 Essential (primary) hypertension: Secondary | ICD-10-CM | POA: Diagnosis not present

## 2015-05-05 IMAGING — CR DG KNEE 1-2V PORT*L*
1 series · 2 of 2 positions shown · non-contrast
Comparison: None.

CLINICAL DATA: Status post left knee replacement

PORTABLE LEFT KNEE - 1-2 VIEW

[Series 1: AP · left · 2 of 2 slices shown]
[im 1/2]
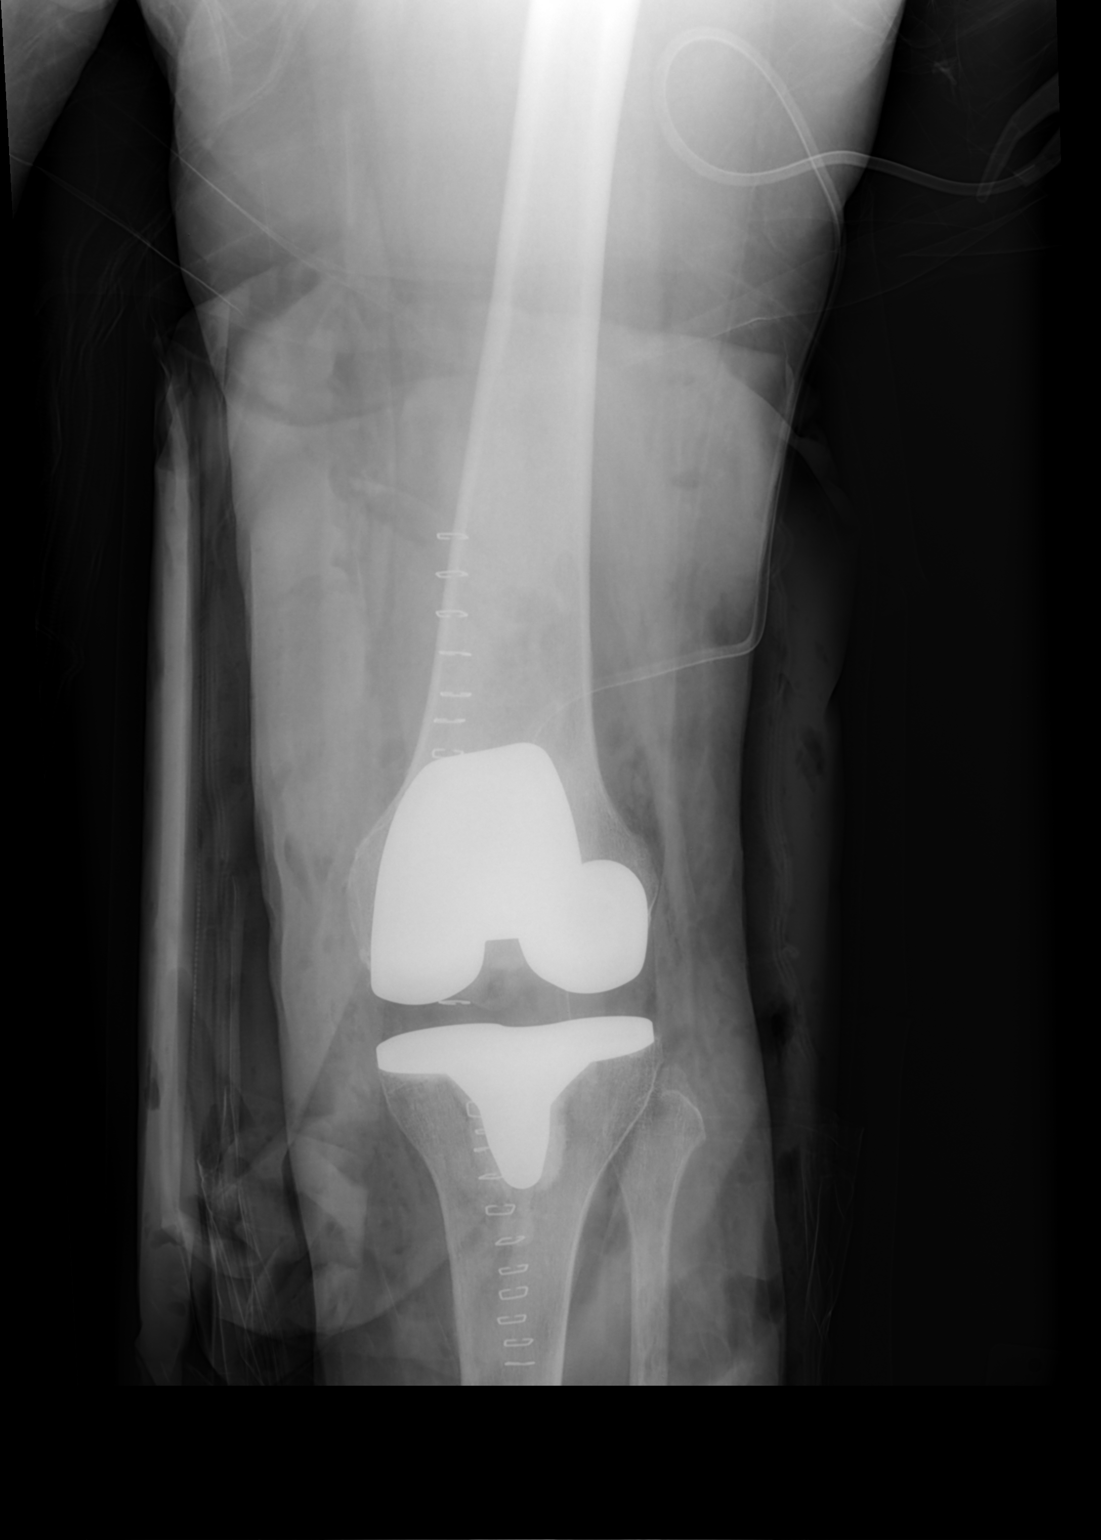
[im 2/2]
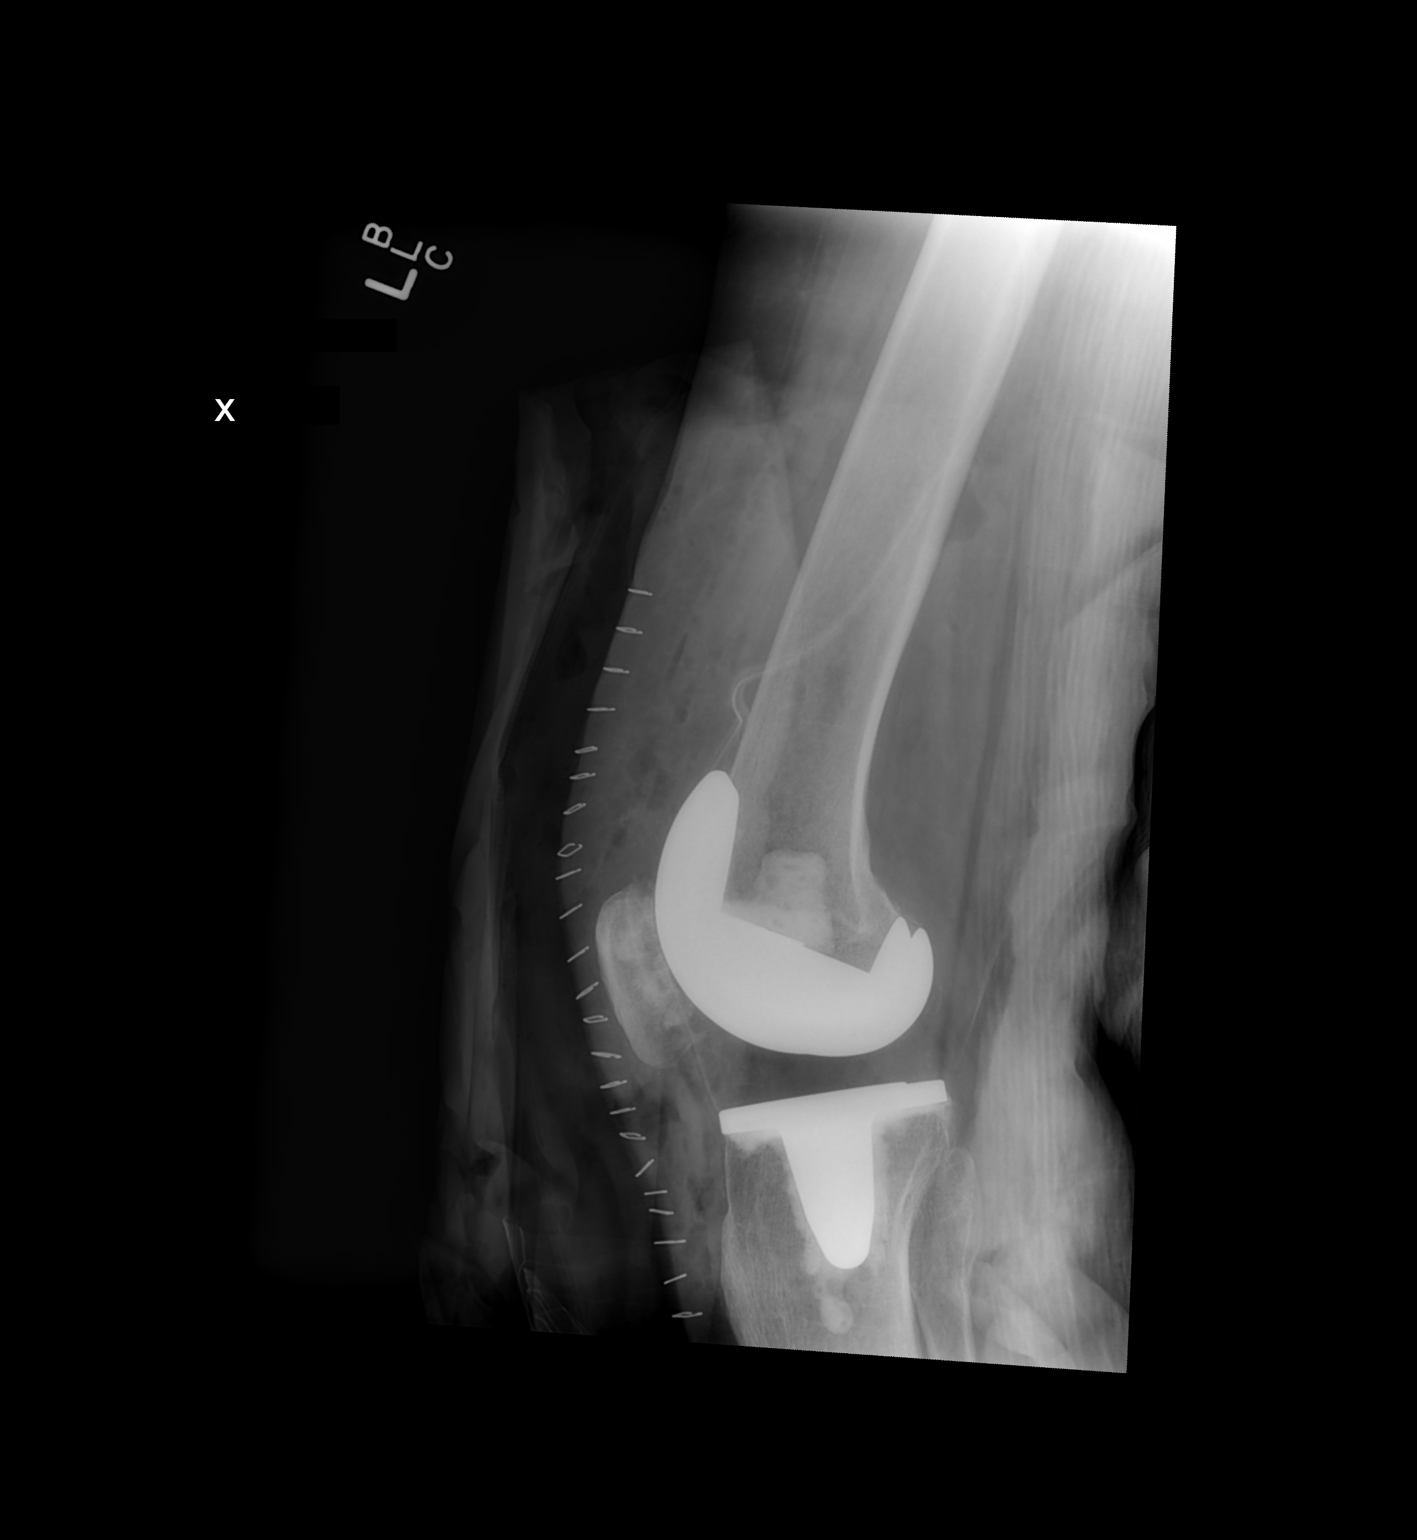

[2 of 2 positions shown; findings below may reference images not displayed]

FINDINGS: There are changes consistent with total knee prosthesis.
A surgical drain is seen.  No acute abnormality is noted.

## 2015-06-01 DIAGNOSIS — R55 Syncope and collapse: Secondary | ICD-10-CM | POA: Diagnosis not present

## 2015-06-01 DIAGNOSIS — R42 Dizziness and giddiness: Secondary | ICD-10-CM | POA: Diagnosis not present

## 2015-06-01 DIAGNOSIS — J9811 Atelectasis: Secondary | ICD-10-CM | POA: Diagnosis not present

## 2015-06-01 DIAGNOSIS — R404 Transient alteration of awareness: Secondary | ICD-10-CM | POA: Diagnosis not present

## 2015-06-02 DIAGNOSIS — Z8673 Personal history of transient ischemic attack (TIA), and cerebral infarction without residual deficits: Secondary | ICD-10-CM | POA: Diagnosis not present

## 2015-06-02 DIAGNOSIS — T50905A Adverse effect of unspecified drugs, medicaments and biological substances, initial encounter: Secondary | ICD-10-CM | POA: Diagnosis present

## 2015-06-02 DIAGNOSIS — R1030 Lower abdominal pain, unspecified: Secondary | ICD-10-CM | POA: Diagnosis present

## 2015-06-02 DIAGNOSIS — R0602 Shortness of breath: Secondary | ICD-10-CM | POA: Diagnosis present

## 2015-06-02 DIAGNOSIS — G9341 Metabolic encephalopathy: Secondary | ICD-10-CM | POA: Diagnosis present

## 2015-06-02 DIAGNOSIS — R680 Hypothermia, not associated with low environmental temperature: Secondary | ICD-10-CM | POA: Diagnosis present

## 2015-06-02 DIAGNOSIS — I959 Hypotension, unspecified: Secondary | ICD-10-CM | POA: Diagnosis not present

## 2015-06-02 DIAGNOSIS — Z9181 History of falling: Secondary | ICD-10-CM | POA: Diagnosis not present

## 2015-06-02 DIAGNOSIS — Z79899 Other long term (current) drug therapy: Secondary | ICD-10-CM | POA: Diagnosis not present

## 2015-06-02 DIAGNOSIS — G8929 Other chronic pain: Secondary | ICD-10-CM | POA: Diagnosis present

## 2015-06-02 DIAGNOSIS — I9589 Other hypotension: Secondary | ICD-10-CM | POA: Diagnosis present

## 2015-06-02 DIAGNOSIS — R42 Dizziness and giddiness: Secondary | ICD-10-CM | POA: Diagnosis not present

## 2015-06-02 DIAGNOSIS — E860 Dehydration: Secondary | ICD-10-CM | POA: Diagnosis not present

## 2015-06-02 DIAGNOSIS — I350 Nonrheumatic aortic (valve) stenosis: Secondary | ICD-10-CM | POA: Diagnosis present

## 2015-06-02 DIAGNOSIS — I119 Hypertensive heart disease without heart failure: Secondary | ICD-10-CM | POA: Diagnosis present

## 2015-06-02 DIAGNOSIS — R55 Syncope and collapse: Secondary | ICD-10-CM | POA: Diagnosis present

## 2015-06-02 DIAGNOSIS — J9811 Atelectasis: Secondary | ICD-10-CM | POA: Diagnosis not present

## 2015-06-02 DIAGNOSIS — R001 Bradycardia, unspecified: Secondary | ICD-10-CM | POA: Diagnosis present

## 2015-06-02 DIAGNOSIS — E7800 Pure hypercholesterolemia, unspecified: Secondary | ICD-10-CM | POA: Diagnosis present

## 2015-06-02 DIAGNOSIS — I10 Essential (primary) hypertension: Secondary | ICD-10-CM | POA: Diagnosis not present

## 2015-06-02 DIAGNOSIS — N3000 Acute cystitis without hematuria: Secondary | ICD-10-CM | POA: Diagnosis present

## 2015-06-02 DIAGNOSIS — R4182 Altered mental status, unspecified: Secondary | ICD-10-CM | POA: Diagnosis not present

## 2015-06-02 DIAGNOSIS — J4520 Mild intermittent asthma, uncomplicated: Secondary | ICD-10-CM | POA: Diagnosis present

## 2015-06-05 DIAGNOSIS — I119 Hypertensive heart disease without heart failure: Secondary | ICD-10-CM | POA: Diagnosis not present

## 2015-06-05 DIAGNOSIS — R262 Difficulty in walking, not elsewhere classified: Secondary | ICD-10-CM | POA: Diagnosis not present

## 2015-06-05 DIAGNOSIS — N3000 Acute cystitis without hematuria: Secondary | ICD-10-CM | POA: Diagnosis not present

## 2015-06-05 DIAGNOSIS — M439 Deforming dorsopathy, unspecified: Secondary | ICD-10-CM | POA: Diagnosis not present

## 2015-06-05 DIAGNOSIS — E785 Hyperlipidemia, unspecified: Secondary | ICD-10-CM | POA: Diagnosis not present

## 2015-06-05 DIAGNOSIS — I350 Nonrheumatic aortic (valve) stenosis: Secondary | ICD-10-CM | POA: Diagnosis not present

## 2015-06-05 DIAGNOSIS — I9589 Other hypotension: Secondary | ICD-10-CM | POA: Diagnosis not present

## 2015-06-05 DIAGNOSIS — G9341 Metabolic encephalopathy: Secondary | ICD-10-CM | POA: Diagnosis not present

## 2015-06-05 DIAGNOSIS — R55 Syncope and collapse: Secondary | ICD-10-CM | POA: Diagnosis not present

## 2015-06-07 DIAGNOSIS — I119 Hypertensive heart disease without heart failure: Secondary | ICD-10-CM | POA: Diagnosis not present

## 2015-06-07 DIAGNOSIS — E785 Hyperlipidemia, unspecified: Secondary | ICD-10-CM | POA: Diagnosis not present

## 2015-06-07 DIAGNOSIS — M439 Deforming dorsopathy, unspecified: Secondary | ICD-10-CM | POA: Diagnosis not present

## 2015-06-07 DIAGNOSIS — R262 Difficulty in walking, not elsewhere classified: Secondary | ICD-10-CM | POA: Diagnosis not present

## 2015-06-24 DIAGNOSIS — M6281 Muscle weakness (generalized): Secondary | ICD-10-CM | POA: Diagnosis not present

## 2015-06-24 DIAGNOSIS — R262 Difficulty in walking, not elsewhere classified: Secondary | ICD-10-CM | POA: Diagnosis not present

## 2015-06-26 DIAGNOSIS — F419 Anxiety disorder, unspecified: Secondary | ICD-10-CM | POA: Diagnosis not present

## 2015-06-26 DIAGNOSIS — M47816 Spondylosis without myelopathy or radiculopathy, lumbar region: Secondary | ICD-10-CM | POA: Diagnosis not present

## 2015-06-26 DIAGNOSIS — I10 Essential (primary) hypertension: Secondary | ICD-10-CM | POA: Diagnosis not present

## 2015-06-26 DIAGNOSIS — G603 Idiopathic progressive neuropathy: Secondary | ICD-10-CM | POA: Diagnosis not present

## 2015-06-27 DIAGNOSIS — M6281 Muscle weakness (generalized): Secondary | ICD-10-CM | POA: Diagnosis not present

## 2015-06-27 DIAGNOSIS — R262 Difficulty in walking, not elsewhere classified: Secondary | ICD-10-CM | POA: Diagnosis not present

## 2015-07-01 DIAGNOSIS — R262 Difficulty in walking, not elsewhere classified: Secondary | ICD-10-CM | POA: Diagnosis not present

## 2015-07-01 DIAGNOSIS — M6281 Muscle weakness (generalized): Secondary | ICD-10-CM | POA: Diagnosis not present

## 2015-07-02 DIAGNOSIS — M6281 Muscle weakness (generalized): Secondary | ICD-10-CM | POA: Diagnosis not present

## 2015-07-02 DIAGNOSIS — R262 Difficulty in walking, not elsewhere classified: Secondary | ICD-10-CM | POA: Diagnosis not present

## 2015-07-03 DIAGNOSIS — M6281 Muscle weakness (generalized): Secondary | ICD-10-CM | POA: Diagnosis not present

## 2015-07-03 DIAGNOSIS — R262 Difficulty in walking, not elsewhere classified: Secondary | ICD-10-CM | POA: Diagnosis not present

## 2015-07-07 DIAGNOSIS — R262 Difficulty in walking, not elsewhere classified: Secondary | ICD-10-CM | POA: Diagnosis not present

## 2015-07-07 DIAGNOSIS — M6281 Muscle weakness (generalized): Secondary | ICD-10-CM | POA: Diagnosis not present

## 2015-07-09 DIAGNOSIS — R262 Difficulty in walking, not elsewhere classified: Secondary | ICD-10-CM | POA: Diagnosis not present

## 2015-07-09 DIAGNOSIS — M6281 Muscle weakness (generalized): Secondary | ICD-10-CM | POA: Diagnosis not present

## 2015-07-10 DIAGNOSIS — C50412 Malignant neoplasm of upper-outer quadrant of left female breast: Secondary | ICD-10-CM | POA: Diagnosis not present

## 2015-07-10 DIAGNOSIS — Z170 Estrogen receptor positive status [ER+]: Secondary | ICD-10-CM | POA: Diagnosis not present

## 2015-07-14 DIAGNOSIS — M6281 Muscle weakness (generalized): Secondary | ICD-10-CM | POA: Diagnosis not present

## 2015-07-14 DIAGNOSIS — R262 Difficulty in walking, not elsewhere classified: Secondary | ICD-10-CM | POA: Diagnosis not present

## 2015-07-21 DIAGNOSIS — R262 Difficulty in walking, not elsewhere classified: Secondary | ICD-10-CM | POA: Diagnosis not present

## 2015-07-21 DIAGNOSIS — M6281 Muscle weakness (generalized): Secondary | ICD-10-CM | POA: Diagnosis not present

## 2015-07-23 DIAGNOSIS — R262 Difficulty in walking, not elsewhere classified: Secondary | ICD-10-CM | POA: Diagnosis not present

## 2015-07-23 DIAGNOSIS — M6281 Muscle weakness (generalized): Secondary | ICD-10-CM | POA: Diagnosis not present

## 2015-07-24 DIAGNOSIS — E559 Vitamin D deficiency, unspecified: Secondary | ICD-10-CM | POA: Diagnosis not present

## 2015-07-28 DIAGNOSIS — R262 Difficulty in walking, not elsewhere classified: Secondary | ICD-10-CM | POA: Diagnosis not present

## 2015-07-28 DIAGNOSIS — M6281 Muscle weakness (generalized): Secondary | ICD-10-CM | POA: Diagnosis not present

## 2015-07-30 DIAGNOSIS — R262 Difficulty in walking, not elsewhere classified: Secondary | ICD-10-CM | POA: Diagnosis not present

## 2015-07-30 DIAGNOSIS — M6281 Muscle weakness (generalized): Secondary | ICD-10-CM | POA: Diagnosis not present

## 2015-08-21 DIAGNOSIS — Z79899 Other long term (current) drug therapy: Secondary | ICD-10-CM | POA: Diagnosis not present

## 2015-08-21 DIAGNOSIS — I10 Essential (primary) hypertension: Secondary | ICD-10-CM | POA: Diagnosis not present

## 2015-08-21 DIAGNOSIS — E785 Hyperlipidemia, unspecified: Secondary | ICD-10-CM | POA: Diagnosis not present

## 2015-08-21 DIAGNOSIS — G603 Idiopathic progressive neuropathy: Secondary | ICD-10-CM | POA: Diagnosis not present

## 2015-08-21 DIAGNOSIS — R1907 Generalized intra-abdominal and pelvic swelling, mass and lump: Secondary | ICD-10-CM | POA: Diagnosis not present

## 2015-08-21 DIAGNOSIS — Z8041 Family history of malignant neoplasm of ovary: Secondary | ICD-10-CM | POA: Diagnosis not present

## 2015-08-21 DIAGNOSIS — R140 Abdominal distension (gaseous): Secondary | ICD-10-CM | POA: Diagnosis not present

## 2015-08-21 DIAGNOSIS — R5383 Other fatigue: Secondary | ICD-10-CM | POA: Diagnosis not present

## 2015-08-22 DIAGNOSIS — Z853 Personal history of malignant neoplasm of breast: Secondary | ICD-10-CM | POA: Diagnosis not present

## 2015-09-02 DIAGNOSIS — M79675 Pain in left toe(s): Secondary | ICD-10-CM | POA: Diagnosis not present

## 2015-09-02 DIAGNOSIS — M205X2 Other deformities of toe(s) (acquired), left foot: Secondary | ICD-10-CM | POA: Diagnosis not present

## 2015-09-16 DIAGNOSIS — L820 Inflamed seborrheic keratosis: Secondary | ICD-10-CM | POA: Diagnosis not present

## 2015-09-16 DIAGNOSIS — L814 Other melanin hyperpigmentation: Secondary | ICD-10-CM | POA: Diagnosis not present

## 2015-09-16 DIAGNOSIS — L578 Other skin changes due to chronic exposure to nonionizing radiation: Secondary | ICD-10-CM | POA: Diagnosis not present

## 2015-09-17 DIAGNOSIS — M8589 Other specified disorders of bone density and structure, multiple sites: Secondary | ICD-10-CM | POA: Diagnosis not present

## 2015-09-17 DIAGNOSIS — Z9889 Other specified postprocedural states: Secondary | ICD-10-CM | POA: Diagnosis not present

## 2015-09-17 DIAGNOSIS — M810 Age-related osteoporosis without current pathological fracture: Secondary | ICD-10-CM | POA: Diagnosis not present

## 2015-09-17 DIAGNOSIS — Z853 Personal history of malignant neoplasm of breast: Secondary | ICD-10-CM | POA: Diagnosis not present

## 2015-09-17 DIAGNOSIS — C50412 Malignant neoplasm of upper-outer quadrant of left female breast: Secondary | ICD-10-CM | POA: Diagnosis not present

## 2015-09-22 DIAGNOSIS — N390 Urinary tract infection, site not specified: Secondary | ICD-10-CM | POA: Diagnosis not present

## 2015-09-26 DIAGNOSIS — R1030 Lower abdominal pain, unspecified: Secondary | ICD-10-CM | POA: Diagnosis not present

## 2015-09-26 DIAGNOSIS — M546 Pain in thoracic spine: Secondary | ICD-10-CM | POA: Diagnosis not present

## 2015-09-26 DIAGNOSIS — M810 Age-related osteoporosis without current pathological fracture: Secondary | ICD-10-CM | POA: Diagnosis not present

## 2015-10-17 DIAGNOSIS — M5124 Other intervertebral disc displacement, thoracic region: Secondary | ICD-10-CM | POA: Diagnosis not present

## 2015-10-17 DIAGNOSIS — R1030 Lower abdominal pain, unspecified: Secondary | ICD-10-CM | POA: Diagnosis not present

## 2015-10-17 DIAGNOSIS — M546 Pain in thoracic spine: Secondary | ICD-10-CM | POA: Diagnosis not present

## 2015-10-17 DIAGNOSIS — M810 Age-related osteoporosis without current pathological fracture: Secondary | ICD-10-CM | POA: Diagnosis not present

## 2015-10-21 DIAGNOSIS — M7061 Trochanteric bursitis, right hip: Secondary | ICD-10-CM | POA: Diagnosis not present

## 2015-10-29 DIAGNOSIS — M810 Age-related osteoporosis without current pathological fracture: Secondary | ICD-10-CM | POA: Diagnosis not present

## 2015-10-29 DIAGNOSIS — M25551 Pain in right hip: Secondary | ICD-10-CM | POA: Diagnosis not present

## 2015-10-29 DIAGNOSIS — G629 Polyneuropathy, unspecified: Secondary | ICD-10-CM | POA: Diagnosis not present

## 2015-10-29 DIAGNOSIS — I119 Hypertensive heart disease without heart failure: Secondary | ICD-10-CM | POA: Diagnosis not present

## 2015-10-29 DIAGNOSIS — Z79891 Long term (current) use of opiate analgesic: Secondary | ICD-10-CM | POA: Diagnosis not present

## 2015-10-29 DIAGNOSIS — M1711 Unilateral primary osteoarthritis, right knee: Secondary | ICD-10-CM | POA: Diagnosis not present

## 2015-10-29 DIAGNOSIS — M4726 Other spondylosis with radiculopathy, lumbar region: Secondary | ICD-10-CM | POA: Diagnosis not present

## 2015-10-29 DIAGNOSIS — M7060 Trochanteric bursitis, unspecified hip: Secondary | ICD-10-CM | POA: Diagnosis not present

## 2015-10-29 DIAGNOSIS — Z853 Personal history of malignant neoplasm of breast: Secondary | ICD-10-CM | POA: Diagnosis not present

## 2015-10-29 DIAGNOSIS — M25469 Effusion, unspecified knee: Secondary | ICD-10-CM | POA: Diagnosis not present

## 2015-10-29 DIAGNOSIS — M5136 Other intervertebral disc degeneration, lumbar region: Secondary | ICD-10-CM | POA: Diagnosis not present

## 2015-10-29 DIAGNOSIS — S22080D Wedge compression fracture of T11-T12 vertebra, subsequent encounter for fracture with routine healing: Secondary | ICD-10-CM | POA: Diagnosis not present

## 2015-10-29 DIAGNOSIS — Z7902 Long term (current) use of antithrombotics/antiplatelets: Secondary | ICD-10-CM | POA: Diagnosis not present

## 2015-10-29 DIAGNOSIS — R269 Unspecified abnormalities of gait and mobility: Secondary | ICD-10-CM | POA: Diagnosis not present

## 2015-10-30 DIAGNOSIS — R269 Unspecified abnormalities of gait and mobility: Secondary | ICD-10-CM | POA: Diagnosis not present

## 2015-10-30 DIAGNOSIS — S22080D Wedge compression fracture of T11-T12 vertebra, subsequent encounter for fracture with routine healing: Secondary | ICD-10-CM | POA: Diagnosis not present

## 2015-10-30 DIAGNOSIS — M5136 Other intervertebral disc degeneration, lumbar region: Secondary | ICD-10-CM | POA: Diagnosis not present

## 2015-10-30 DIAGNOSIS — M4726 Other spondylosis with radiculopathy, lumbar region: Secondary | ICD-10-CM | POA: Diagnosis not present

## 2015-10-30 DIAGNOSIS — M7060 Trochanteric bursitis, unspecified hip: Secondary | ICD-10-CM | POA: Diagnosis not present

## 2015-10-30 DIAGNOSIS — M25551 Pain in right hip: Secondary | ICD-10-CM | POA: Diagnosis not present

## 2015-11-03 DIAGNOSIS — M7060 Trochanteric bursitis, unspecified hip: Secondary | ICD-10-CM | POA: Diagnosis not present

## 2015-11-03 DIAGNOSIS — M25551 Pain in right hip: Secondary | ICD-10-CM | POA: Diagnosis not present

## 2015-11-03 DIAGNOSIS — M4726 Other spondylosis with radiculopathy, lumbar region: Secondary | ICD-10-CM | POA: Diagnosis not present

## 2015-11-03 DIAGNOSIS — R269 Unspecified abnormalities of gait and mobility: Secondary | ICD-10-CM | POA: Diagnosis not present

## 2015-11-03 DIAGNOSIS — M5136 Other intervertebral disc degeneration, lumbar region: Secondary | ICD-10-CM | POA: Diagnosis not present

## 2015-11-03 DIAGNOSIS — S22080D Wedge compression fracture of T11-T12 vertebra, subsequent encounter for fracture with routine healing: Secondary | ICD-10-CM | POA: Diagnosis not present

## 2015-11-05 DIAGNOSIS — M47816 Spondylosis without myelopathy or radiculopathy, lumbar region: Secondary | ICD-10-CM | POA: Diagnosis not present

## 2015-11-05 DIAGNOSIS — N952 Postmenopausal atrophic vaginitis: Secondary | ICD-10-CM | POA: Diagnosis not present

## 2015-11-05 DIAGNOSIS — M25551 Pain in right hip: Secondary | ICD-10-CM | POA: Diagnosis not present

## 2015-11-05 DIAGNOSIS — N390 Urinary tract infection, site not specified: Secondary | ICD-10-CM | POA: Diagnosis not present

## 2015-11-06 DIAGNOSIS — R269 Unspecified abnormalities of gait and mobility: Secondary | ICD-10-CM | POA: Diagnosis not present

## 2015-11-06 DIAGNOSIS — M25551 Pain in right hip: Secondary | ICD-10-CM | POA: Diagnosis not present

## 2015-11-06 DIAGNOSIS — S22080D Wedge compression fracture of T11-T12 vertebra, subsequent encounter for fracture with routine healing: Secondary | ICD-10-CM | POA: Diagnosis not present

## 2015-11-06 DIAGNOSIS — M5136 Other intervertebral disc degeneration, lumbar region: Secondary | ICD-10-CM | POA: Diagnosis not present

## 2015-11-06 DIAGNOSIS — M7060 Trochanteric bursitis, unspecified hip: Secondary | ICD-10-CM | POA: Diagnosis not present

## 2015-11-06 DIAGNOSIS — M4726 Other spondylosis with radiculopathy, lumbar region: Secondary | ICD-10-CM | POA: Diagnosis not present

## 2015-11-07 ENCOUNTER — Other Ambulatory Visit: Payer: Self-pay

## 2015-11-11 DIAGNOSIS — R269 Unspecified abnormalities of gait and mobility: Secondary | ICD-10-CM | POA: Diagnosis not present

## 2015-11-11 DIAGNOSIS — M4726 Other spondylosis with radiculopathy, lumbar region: Secondary | ICD-10-CM | POA: Diagnosis not present

## 2015-11-11 DIAGNOSIS — M25551 Pain in right hip: Secondary | ICD-10-CM | POA: Diagnosis not present

## 2015-11-11 DIAGNOSIS — M7060 Trochanteric bursitis, unspecified hip: Secondary | ICD-10-CM | POA: Diagnosis not present

## 2015-11-11 DIAGNOSIS — M5136 Other intervertebral disc degeneration, lumbar region: Secondary | ICD-10-CM | POA: Diagnosis not present

## 2015-11-11 DIAGNOSIS — S22080D Wedge compression fracture of T11-T12 vertebra, subsequent encounter for fracture with routine healing: Secondary | ICD-10-CM | POA: Diagnosis not present

## 2015-11-13 DIAGNOSIS — M5136 Other intervertebral disc degeneration, lumbar region: Secondary | ICD-10-CM | POA: Diagnosis not present

## 2015-11-13 DIAGNOSIS — M25551 Pain in right hip: Secondary | ICD-10-CM | POA: Diagnosis not present

## 2015-11-13 DIAGNOSIS — R269 Unspecified abnormalities of gait and mobility: Secondary | ICD-10-CM | POA: Diagnosis not present

## 2015-11-13 DIAGNOSIS — M7060 Trochanteric bursitis, unspecified hip: Secondary | ICD-10-CM | POA: Diagnosis not present

## 2015-11-13 DIAGNOSIS — M4726 Other spondylosis with radiculopathy, lumbar region: Secondary | ICD-10-CM | POA: Diagnosis not present

## 2015-11-13 DIAGNOSIS — S22080D Wedge compression fracture of T11-T12 vertebra, subsequent encounter for fracture with routine healing: Secondary | ICD-10-CM | POA: Diagnosis not present

## 2015-11-18 DIAGNOSIS — M4726 Other spondylosis with radiculopathy, lumbar region: Secondary | ICD-10-CM | POA: Diagnosis not present

## 2015-11-18 DIAGNOSIS — M25551 Pain in right hip: Secondary | ICD-10-CM | POA: Diagnosis not present

## 2015-11-18 DIAGNOSIS — M7060 Trochanteric bursitis, unspecified hip: Secondary | ICD-10-CM | POA: Diagnosis not present

## 2015-11-18 DIAGNOSIS — M5136 Other intervertebral disc degeneration, lumbar region: Secondary | ICD-10-CM | POA: Diagnosis not present

## 2015-11-18 DIAGNOSIS — R269 Unspecified abnormalities of gait and mobility: Secondary | ICD-10-CM | POA: Diagnosis not present

## 2015-11-18 DIAGNOSIS — S22080D Wedge compression fracture of T11-T12 vertebra, subsequent encounter for fracture with routine healing: Secondary | ICD-10-CM | POA: Diagnosis not present

## 2015-11-20 DIAGNOSIS — R269 Unspecified abnormalities of gait and mobility: Secondary | ICD-10-CM | POA: Diagnosis not present

## 2015-11-20 DIAGNOSIS — M25551 Pain in right hip: Secondary | ICD-10-CM | POA: Diagnosis not present

## 2015-11-20 DIAGNOSIS — M4726 Other spondylosis with radiculopathy, lumbar region: Secondary | ICD-10-CM | POA: Diagnosis not present

## 2015-11-20 DIAGNOSIS — S22080D Wedge compression fracture of T11-T12 vertebra, subsequent encounter for fracture with routine healing: Secondary | ICD-10-CM | POA: Diagnosis not present

## 2015-11-20 DIAGNOSIS — M7060 Trochanteric bursitis, unspecified hip: Secondary | ICD-10-CM | POA: Diagnosis not present

## 2015-11-20 DIAGNOSIS — M5136 Other intervertebral disc degeneration, lumbar region: Secondary | ICD-10-CM | POA: Diagnosis not present

## 2015-11-22 DIAGNOSIS — Z23 Encounter for immunization: Secondary | ICD-10-CM | POA: Diagnosis not present

## 2015-11-24 DIAGNOSIS — M25551 Pain in right hip: Secondary | ICD-10-CM | POA: Diagnosis not present

## 2015-11-24 DIAGNOSIS — M5126 Other intervertebral disc displacement, lumbar region: Secondary | ICD-10-CM | POA: Diagnosis not present

## 2015-11-24 DIAGNOSIS — Z79899 Other long term (current) drug therapy: Secondary | ICD-10-CM | POA: Diagnosis not present

## 2015-12-02 DIAGNOSIS — M7061 Trochanteric bursitis, right hip: Secondary | ICD-10-CM | POA: Diagnosis not present

## 2015-12-23 DIAGNOSIS — M5137 Other intervertebral disc degeneration, lumbosacral region: Secondary | ICD-10-CM | POA: Diagnosis not present

## 2015-12-23 DIAGNOSIS — M5417 Radiculopathy, lumbosacral region: Secondary | ICD-10-CM | POA: Diagnosis not present

## 2015-12-23 DIAGNOSIS — G894 Chronic pain syndrome: Secondary | ICD-10-CM | POA: Diagnosis not present

## 2015-12-23 DIAGNOSIS — M5136 Other intervertebral disc degeneration, lumbar region: Secondary | ICD-10-CM | POA: Diagnosis not present

## 2015-12-23 DIAGNOSIS — M5117 Intervertebral disc disorders with radiculopathy, lumbosacral region: Secondary | ICD-10-CM | POA: Diagnosis not present

## 2015-12-23 DIAGNOSIS — Z888 Allergy status to other drugs, medicaments and biological substances status: Secondary | ICD-10-CM | POA: Diagnosis not present

## 2015-12-23 DIAGNOSIS — Z882 Allergy status to sulfonamides status: Secondary | ICD-10-CM | POA: Diagnosis not present

## 2015-12-23 DIAGNOSIS — Z881 Allergy status to other antibiotic agents status: Secondary | ICD-10-CM | POA: Diagnosis not present

## 2015-12-23 DIAGNOSIS — Z886 Allergy status to analgesic agent status: Secondary | ICD-10-CM | POA: Diagnosis not present

## 2015-12-23 DIAGNOSIS — Z7902 Long term (current) use of antithrombotics/antiplatelets: Secondary | ICD-10-CM | POA: Diagnosis not present

## 2015-12-23 DIAGNOSIS — I10 Essential (primary) hypertension: Secondary | ICD-10-CM | POA: Diagnosis not present

## 2015-12-23 DIAGNOSIS — G629 Polyneuropathy, unspecified: Secondary | ICD-10-CM | POA: Diagnosis not present

## 2015-12-23 DIAGNOSIS — Z79899 Other long term (current) drug therapy: Secondary | ICD-10-CM | POA: Diagnosis not present

## 2015-12-23 DIAGNOSIS — Z8673 Personal history of transient ischemic attack (TIA), and cerebral infarction without residual deficits: Secondary | ICD-10-CM | POA: Diagnosis not present

## 2015-12-25 DIAGNOSIS — N390 Urinary tract infection, site not specified: Secondary | ICD-10-CM | POA: Diagnosis not present

## 2015-12-25 DIAGNOSIS — Z23 Encounter for immunization: Secondary | ICD-10-CM | POA: Diagnosis not present

## 2015-12-25 DIAGNOSIS — G603 Idiopathic progressive neuropathy: Secondary | ICD-10-CM | POA: Diagnosis not present

## 2015-12-25 DIAGNOSIS — Z79899 Other long term (current) drug therapy: Secondary | ICD-10-CM | POA: Diagnosis not present

## 2015-12-25 DIAGNOSIS — I10 Essential (primary) hypertension: Secondary | ICD-10-CM | POA: Diagnosis not present

## 2015-12-25 DIAGNOSIS — M47816 Spondylosis without myelopathy or radiculopathy, lumbar region: Secondary | ICD-10-CM | POA: Diagnosis not present

## 2015-12-25 DIAGNOSIS — E785 Hyperlipidemia, unspecified: Secondary | ICD-10-CM | POA: Diagnosis not present

## 2015-12-26 DIAGNOSIS — E785 Hyperlipidemia, unspecified: Secondary | ICD-10-CM | POA: Diagnosis not present

## 2015-12-26 DIAGNOSIS — N390 Urinary tract infection, site not specified: Secondary | ICD-10-CM | POA: Diagnosis not present

## 2015-12-26 DIAGNOSIS — Z79899 Other long term (current) drug therapy: Secondary | ICD-10-CM | POA: Diagnosis not present

## 2016-01-12 DIAGNOSIS — M5417 Radiculopathy, lumbosacral region: Secondary | ICD-10-CM | POA: Diagnosis not present

## 2016-02-02 DIAGNOSIS — Z886 Allergy status to analgesic agent status: Secondary | ICD-10-CM | POA: Diagnosis not present

## 2016-02-02 DIAGNOSIS — M5117 Intervertebral disc disorders with radiculopathy, lumbosacral region: Secondary | ICD-10-CM | POA: Diagnosis not present

## 2016-02-02 DIAGNOSIS — M5417 Radiculopathy, lumbosacral region: Secondary | ICD-10-CM | POA: Diagnosis not present

## 2016-02-02 DIAGNOSIS — M5136 Other intervertebral disc degeneration, lumbar region: Secondary | ICD-10-CM | POA: Diagnosis not present

## 2016-02-02 DIAGNOSIS — Z7902 Long term (current) use of antithrombotics/antiplatelets: Secondary | ICD-10-CM | POA: Diagnosis not present

## 2016-02-02 DIAGNOSIS — Z8673 Personal history of transient ischemic attack (TIA), and cerebral infarction without residual deficits: Secondary | ICD-10-CM | POA: Diagnosis not present

## 2016-02-02 DIAGNOSIS — G8929 Other chronic pain: Secondary | ICD-10-CM | POA: Diagnosis not present

## 2016-02-02 DIAGNOSIS — Z79899 Other long term (current) drug therapy: Secondary | ICD-10-CM | POA: Diagnosis not present

## 2016-02-02 DIAGNOSIS — Z888 Allergy status to other drugs, medicaments and biological substances status: Secondary | ICD-10-CM | POA: Diagnosis not present

## 2016-02-02 DIAGNOSIS — Z9889 Other specified postprocedural states: Secondary | ICD-10-CM | POA: Diagnosis not present

## 2016-02-02 DIAGNOSIS — I10 Essential (primary) hypertension: Secondary | ICD-10-CM | POA: Diagnosis not present

## 2016-02-02 DIAGNOSIS — Z882 Allergy status to sulfonamides status: Secondary | ICD-10-CM | POA: Diagnosis not present

## 2016-03-12 IMAGING — CR DG CHEST 2V
2 series · 2 of 2 positions shown · non-contrast
Comparison: Chest x-ray of 08/25/2012

CLINICAL DATA: Preop for decompression and kyphoplasty

EXAM:
CHEST  2 VIEW

[w chest pa]
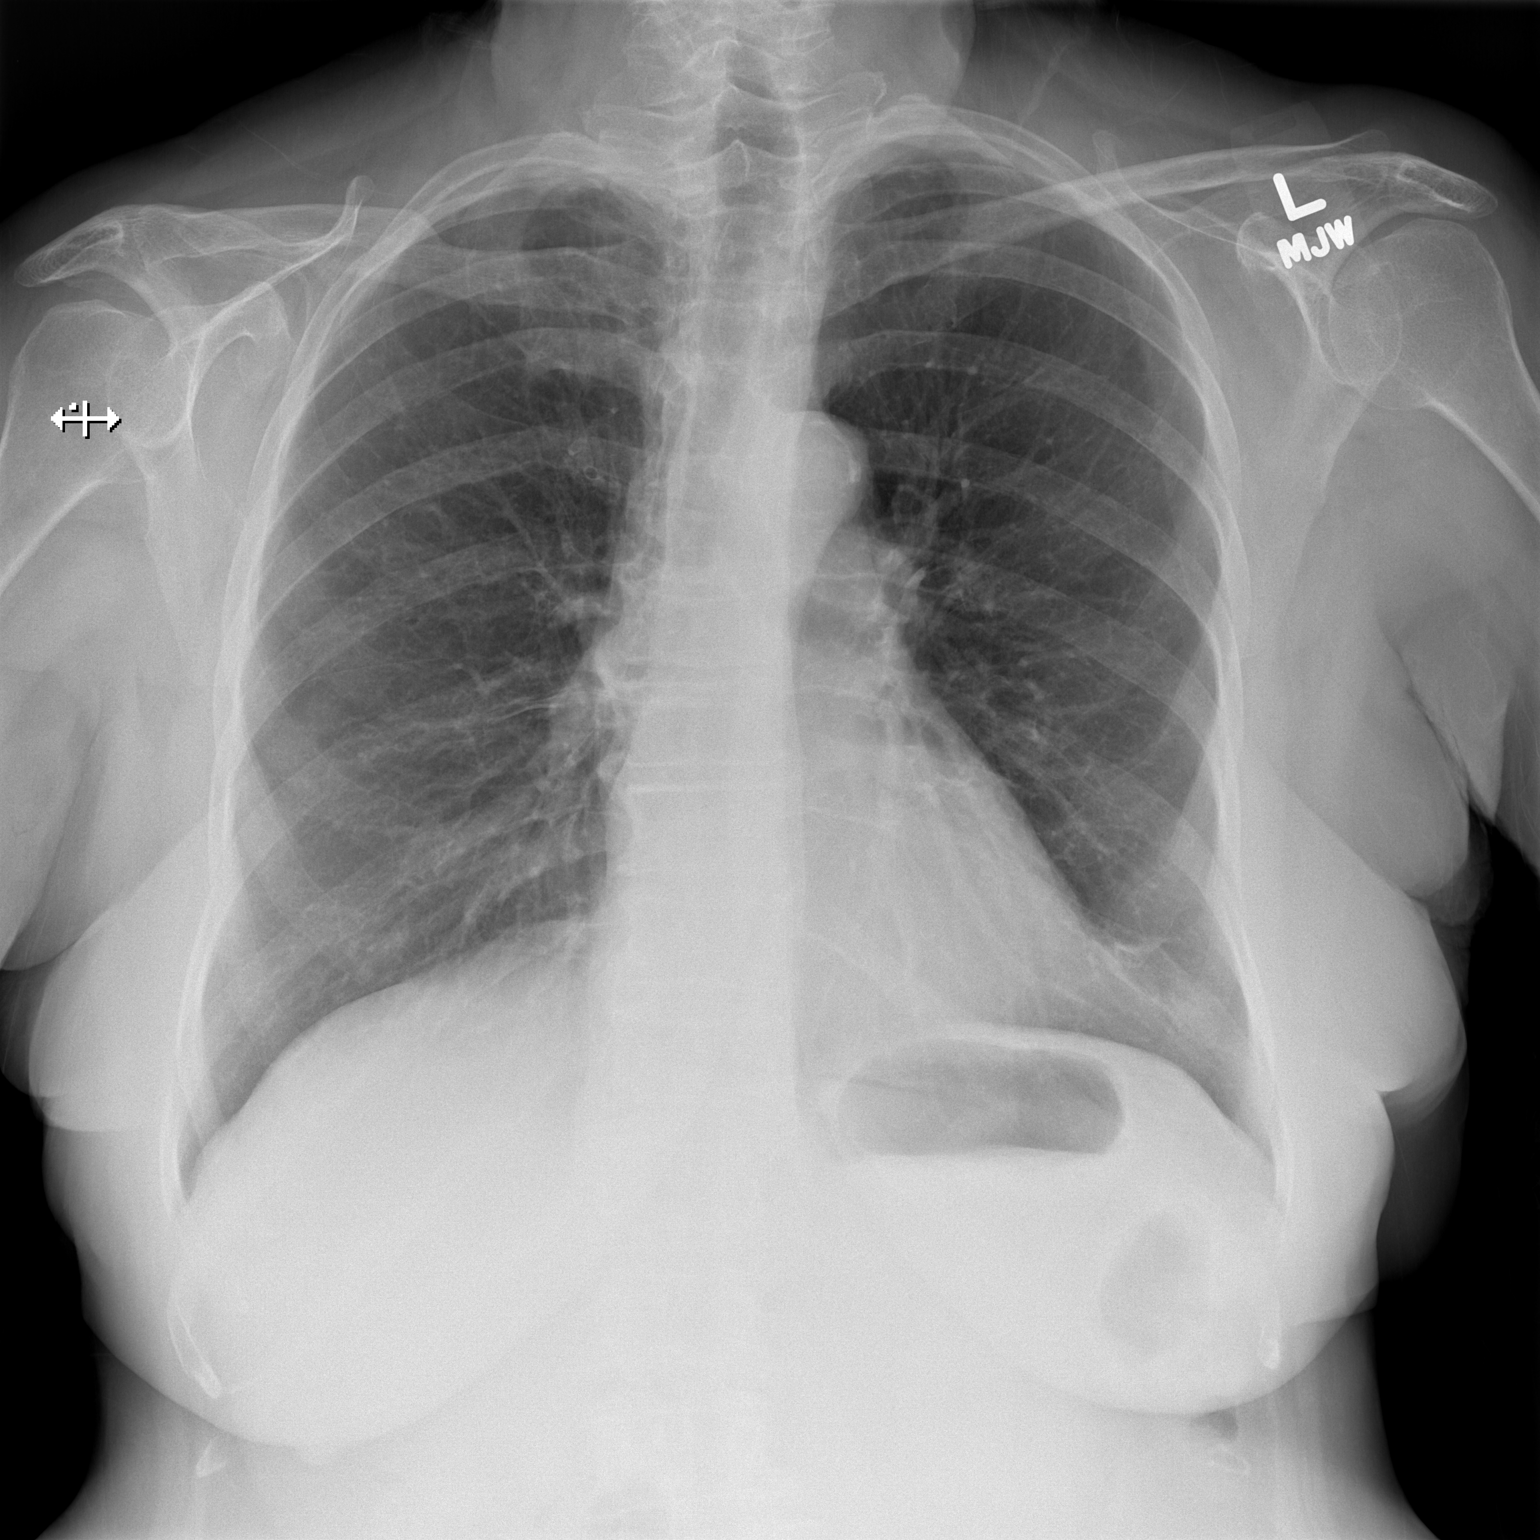

[w chest lat]
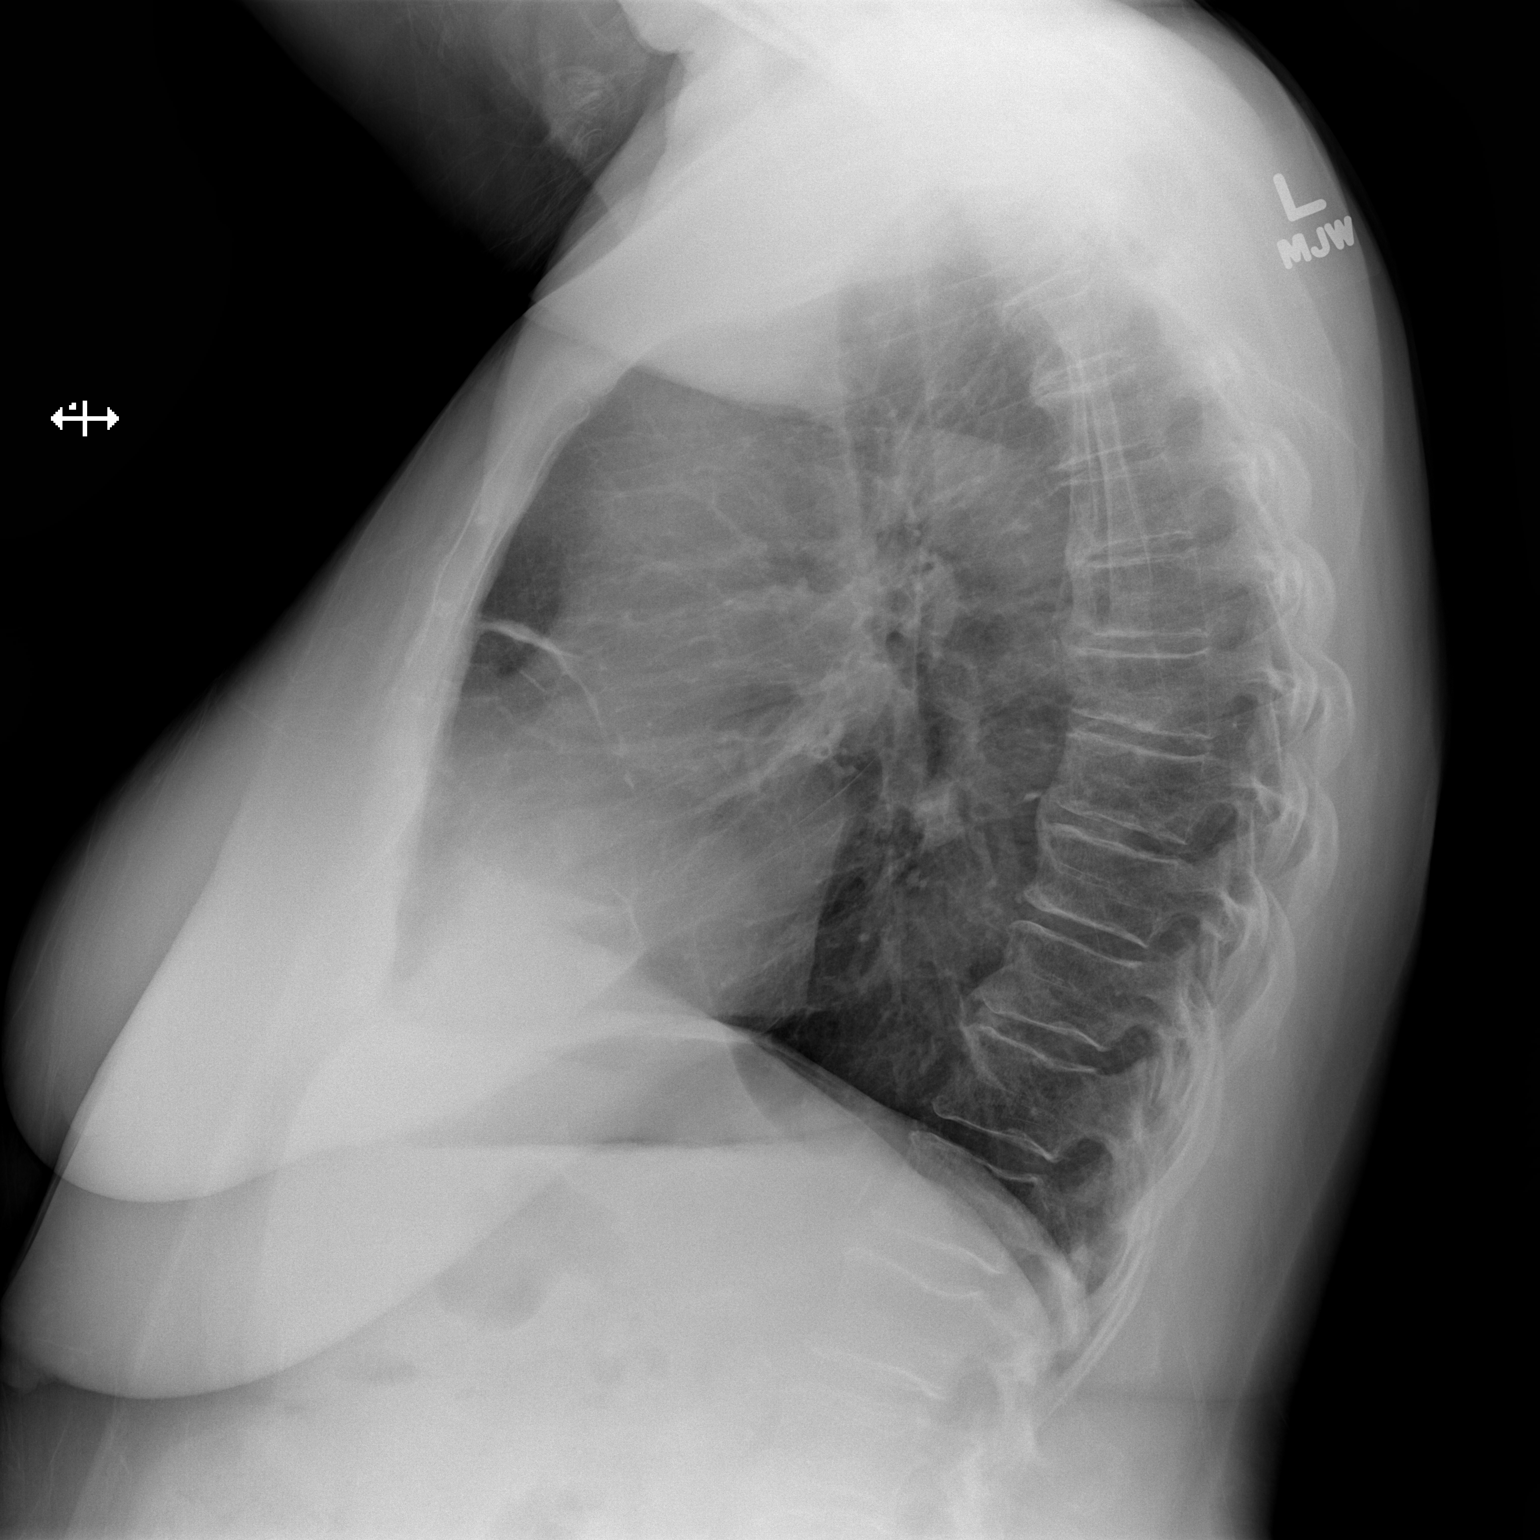

[2 of 2 positions shown; findings below may reference images not displayed]

FINDINGS: No active infiltrate or effusion is seen. Linear scarring is noted
better seen on the lateral view probably within an anterior upper
lobe. Some peribronchial thickening may indicate bronchitis.
Mediastinal and hilar contours are unremarkable and the heart is
within normal limits in size. There are degenerative changes
diffusely throughout the thoracic spine
IMPRESSION: No active cardiopulmonary disease. Probable linear scarring
anteriorly within an upper lobe.

## 2016-03-21 IMAGING — RF DG C-ARM 61-120 MIN
1 series · 1 of 1 positions shown · non-contrast
Comparison: None

CLINICAL DATA: Stenosis at L4 compression fracture

EXAM:
DG C-ARM 61-120 MIN; LUMBAR SPINE - 2-3 VIEW

[Series 1: run · 1 of 1 slices shown]
[im 1/1]
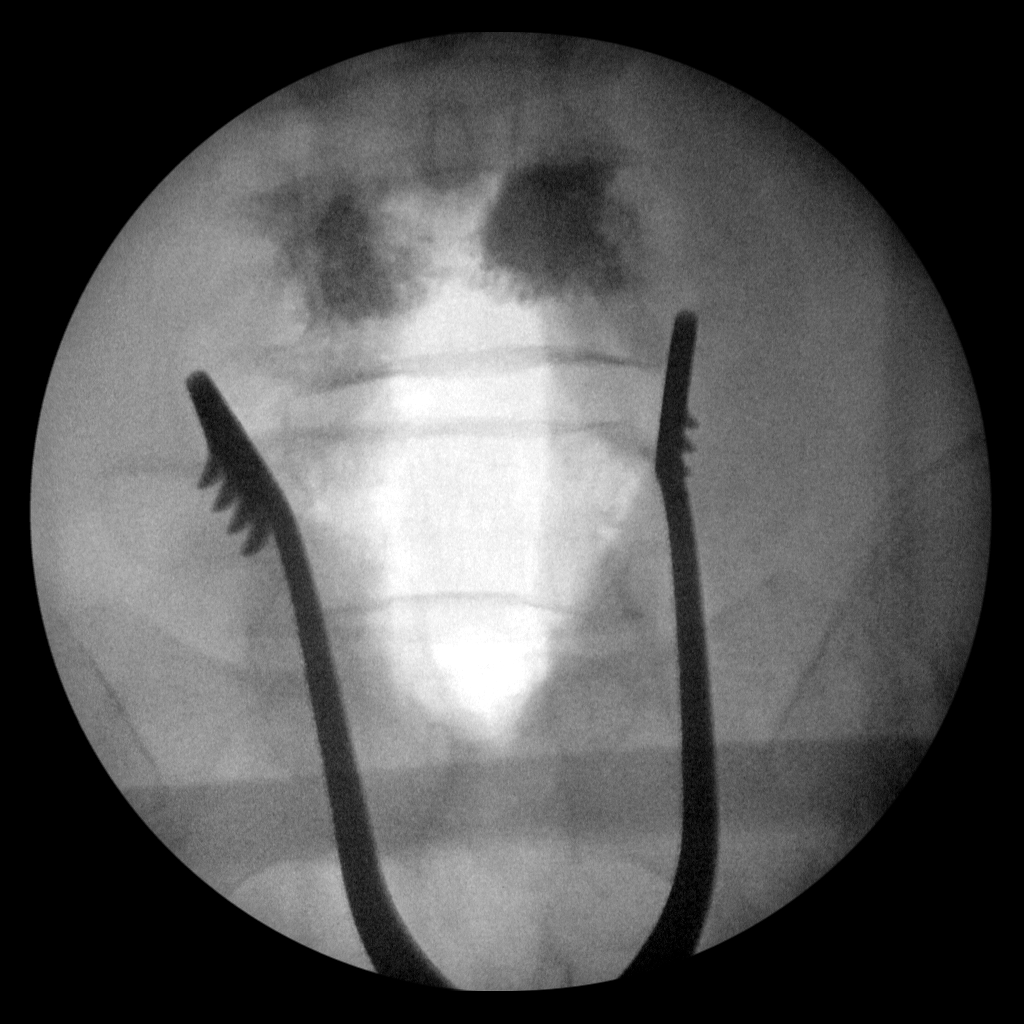

[1 of 1 positions shown; findings below may reference images not displayed]

FINDINGS: Frontal and lateral views were obtained. There is evidence of
laminectomy from L4 to S1. Patient has had undergone kyphoplasty
procedure at L4. There is no spondylolisthesis. There is slight disc
space narrowing at L4-5.
IMPRESSION: Status post kyphoplasty at L4. Laminectomy defects from L4 to S1. No
spondylolisthesis.

## 2016-04-26 DIAGNOSIS — Z79899 Other long term (current) drug therapy: Secondary | ICD-10-CM | POA: Diagnosis not present

## 2016-04-26 DIAGNOSIS — I10 Essential (primary) hypertension: Secondary | ICD-10-CM | POA: Diagnosis not present

## 2016-04-26 DIAGNOSIS — D509 Iron deficiency anemia, unspecified: Secondary | ICD-10-CM | POA: Diagnosis not present

## 2016-04-26 DIAGNOSIS — E559 Vitamin D deficiency, unspecified: Secondary | ICD-10-CM | POA: Diagnosis not present

## 2016-04-26 DIAGNOSIS — E785 Hyperlipidemia, unspecified: Secondary | ICD-10-CM | POA: Diagnosis not present

## 2016-04-26 DIAGNOSIS — J309 Allergic rhinitis, unspecified: Secondary | ICD-10-CM | POA: Diagnosis not present

## 2016-04-26 DIAGNOSIS — M47816 Spondylosis without myelopathy or radiculopathy, lumbar region: Secondary | ICD-10-CM | POA: Diagnosis not present

## 2016-04-26 DIAGNOSIS — M810 Age-related osteoporosis without current pathological fracture: Secondary | ICD-10-CM | POA: Diagnosis not present

## 2016-08-20 DIAGNOSIS — Z853 Personal history of malignant neoplasm of breast: Secondary | ICD-10-CM | POA: Diagnosis not present

## 2016-08-24 DIAGNOSIS — I10 Essential (primary) hypertension: Secondary | ICD-10-CM | POA: Diagnosis not present

## 2016-08-24 DIAGNOSIS — M47816 Spondylosis without myelopathy or radiculopathy, lumbar region: Secondary | ICD-10-CM | POA: Diagnosis not present

## 2016-08-24 DIAGNOSIS — G4700 Insomnia, unspecified: Secondary | ICD-10-CM | POA: Diagnosis not present

## 2016-08-24 DIAGNOSIS — G603 Idiopathic progressive neuropathy: Secondary | ICD-10-CM | POA: Diagnosis not present

## 2016-09-02 DIAGNOSIS — Z79899 Other long term (current) drug therapy: Secondary | ICD-10-CM | POA: Diagnosis not present

## 2016-09-02 DIAGNOSIS — M205X2 Other deformities of toe(s) (acquired), left foot: Secondary | ICD-10-CM | POA: Diagnosis not present

## 2016-09-02 DIAGNOSIS — E785 Hyperlipidemia, unspecified: Secondary | ICD-10-CM | POA: Diagnosis not present

## 2016-09-02 DIAGNOSIS — M79675 Pain in left toe(s): Secondary | ICD-10-CM | POA: Diagnosis not present

## 2016-09-20 DIAGNOSIS — S51811A Laceration without foreign body of right forearm, initial encounter: Secondary | ICD-10-CM | POA: Diagnosis not present

## 2016-09-20 DIAGNOSIS — J302 Other seasonal allergic rhinitis: Secondary | ICD-10-CM | POA: Diagnosis not present

## 2016-09-27 DIAGNOSIS — C50412 Malignant neoplasm of upper-outer quadrant of left female breast: Secondary | ICD-10-CM | POA: Diagnosis not present

## 2016-09-27 DIAGNOSIS — R928 Other abnormal and inconclusive findings on diagnostic imaging of breast: Secondary | ICD-10-CM | POA: Diagnosis not present

## 2016-10-28 DIAGNOSIS — M7551 Bursitis of right shoulder: Secondary | ICD-10-CM | POA: Diagnosis not present

## 2016-10-28 DIAGNOSIS — M25619 Stiffness of unspecified shoulder, not elsewhere classified: Secondary | ICD-10-CM | POA: Diagnosis not present

## 2016-10-28 DIAGNOSIS — M25511 Pain in right shoulder: Secondary | ICD-10-CM | POA: Diagnosis not present

## 2016-10-31 DIAGNOSIS — N3000 Acute cystitis without hematuria: Secondary | ICD-10-CM | POA: Diagnosis not present

## 2016-10-31 DIAGNOSIS — N3090 Cystitis, unspecified without hematuria: Secondary | ICD-10-CM | POA: Diagnosis not present

## 2016-11-30 DIAGNOSIS — Z79899 Other long term (current) drug therapy: Secondary | ICD-10-CM | POA: Diagnosis not present

## 2016-11-30 DIAGNOSIS — I10 Essential (primary) hypertension: Secondary | ICD-10-CM | POA: Diagnosis not present

## 2016-11-30 DIAGNOSIS — M47816 Spondylosis without myelopathy or radiculopathy, lumbar region: Secondary | ICD-10-CM | POA: Diagnosis not present

## 2016-11-30 DIAGNOSIS — E785 Hyperlipidemia, unspecified: Secondary | ICD-10-CM | POA: Diagnosis not present

## 2016-12-04 DIAGNOSIS — Z23 Encounter for immunization: Secondary | ICD-10-CM | POA: Diagnosis not present

## 2017-02-24 DIAGNOSIS — M546 Pain in thoracic spine: Secondary | ICD-10-CM | POA: Diagnosis not present

## 2017-03-03 DIAGNOSIS — C50412 Malignant neoplasm of upper-outer quadrant of left female breast: Secondary | ICD-10-CM | POA: Diagnosis not present

## 2017-04-01 DIAGNOSIS — R0989 Other specified symptoms and signs involving the circulatory and respiratory systems: Secondary | ICD-10-CM | POA: Diagnosis not present

## 2017-04-01 DIAGNOSIS — I10 Essential (primary) hypertension: Secondary | ICD-10-CM | POA: Diagnosis not present

## 2017-04-01 DIAGNOSIS — E785 Hyperlipidemia, unspecified: Secondary | ICD-10-CM | POA: Diagnosis not present

## 2017-04-01 DIAGNOSIS — Z79899 Other long term (current) drug therapy: Secondary | ICD-10-CM | POA: Diagnosis not present

## 2017-04-01 DIAGNOSIS — L304 Erythema intertrigo: Secondary | ICD-10-CM | POA: Diagnosis not present

## 2017-04-06 DIAGNOSIS — M545 Low back pain: Secondary | ICD-10-CM | POA: Diagnosis not present

## 2017-04-06 DIAGNOSIS — M5136 Other intervertebral disc degeneration, lumbar region: Secondary | ICD-10-CM | POA: Diagnosis not present

## 2017-04-06 DIAGNOSIS — M4716 Other spondylosis with myelopathy, lumbar region: Secondary | ICD-10-CM | POA: Diagnosis not present

## 2017-04-06 DIAGNOSIS — Z6831 Body mass index (BMI) 31.0-31.9, adult: Secondary | ICD-10-CM | POA: Diagnosis not present

## 2017-04-12 DIAGNOSIS — M5136 Other intervertebral disc degeneration, lumbar region: Secondary | ICD-10-CM | POA: Diagnosis not present

## 2017-04-12 DIAGNOSIS — M48061 Spinal stenosis, lumbar region without neurogenic claudication: Secondary | ICD-10-CM | POA: Diagnosis not present

## 2017-04-21 DIAGNOSIS — M5136 Other intervertebral disc degeneration, lumbar region: Secondary | ICD-10-CM | POA: Diagnosis not present

## 2017-04-21 DIAGNOSIS — M545 Low back pain: Secondary | ICD-10-CM | POA: Diagnosis not present

## 2017-05-03 DIAGNOSIS — R109 Unspecified abdominal pain: Secondary | ICD-10-CM | POA: Diagnosis not present

## 2017-05-03 DIAGNOSIS — R1031 Right lower quadrant pain: Secondary | ICD-10-CM | POA: Diagnosis not present

## 2017-05-03 DIAGNOSIS — N200 Calculus of kidney: Secondary | ICD-10-CM | POA: Diagnosis not present

## 2017-05-03 DIAGNOSIS — R7989 Other specified abnormal findings of blood chemistry: Secondary | ICD-10-CM | POA: Diagnosis not present

## 2017-05-09 DIAGNOSIS — M7060 Trochanteric bursitis, unspecified hip: Secondary | ICD-10-CM | POA: Diagnosis not present

## 2017-05-16 DIAGNOSIS — M47816 Spondylosis without myelopathy or radiculopathy, lumbar region: Secondary | ICD-10-CM | POA: Diagnosis not present

## 2017-06-06 DIAGNOSIS — M47816 Spondylosis without myelopathy or radiculopathy, lumbar region: Secondary | ICD-10-CM | POA: Diagnosis not present

## 2017-06-28 DIAGNOSIS — M47816 Spondylosis without myelopathy or radiculopathy, lumbar region: Secondary | ICD-10-CM | POA: Diagnosis not present

## 2017-07-26 DIAGNOSIS — M791 Myalgia, unspecified site: Secondary | ICD-10-CM | POA: Diagnosis not present

## 2017-07-26 DIAGNOSIS — M47816 Spondylosis without myelopathy or radiculopathy, lumbar region: Secondary | ICD-10-CM | POA: Diagnosis not present

## 2017-08-12 DIAGNOSIS — Z6831 Body mass index (BMI) 31.0-31.9, adult: Secondary | ICD-10-CM | POA: Diagnosis not present

## 2017-08-12 DIAGNOSIS — M47816 Spondylosis without myelopathy or radiculopathy, lumbar region: Secondary | ICD-10-CM | POA: Diagnosis not present

## 2017-08-16 DIAGNOSIS — L97521 Non-pressure chronic ulcer of other part of left foot limited to breakdown of skin: Secondary | ICD-10-CM | POA: Diagnosis not present

## 2017-08-16 DIAGNOSIS — M205X2 Other deformities of toe(s) (acquired), left foot: Secondary | ICD-10-CM | POA: Diagnosis not present

## 2017-08-19 DIAGNOSIS — Z853 Personal history of malignant neoplasm of breast: Secondary | ICD-10-CM | POA: Diagnosis not present

## 2017-08-23 DIAGNOSIS — M79675 Pain in left toe(s): Secondary | ICD-10-CM | POA: Diagnosis not present

## 2017-08-23 DIAGNOSIS — M205X2 Other deformities of toe(s) (acquired), left foot: Secondary | ICD-10-CM | POA: Diagnosis not present

## 2017-08-23 DIAGNOSIS — L97521 Non-pressure chronic ulcer of other part of left foot limited to breakdown of skin: Secondary | ICD-10-CM | POA: Diagnosis not present

## 2017-09-05 DIAGNOSIS — E785 Hyperlipidemia, unspecified: Secondary | ICD-10-CM | POA: Diagnosis not present

## 2017-09-05 DIAGNOSIS — Z79899 Other long term (current) drug therapy: Secondary | ICD-10-CM | POA: Diagnosis not present

## 2017-09-05 DIAGNOSIS — M47816 Spondylosis without myelopathy or radiculopathy, lumbar region: Secondary | ICD-10-CM | POA: Diagnosis not present

## 2017-09-05 DIAGNOSIS — K5909 Other constipation: Secondary | ICD-10-CM | POA: Diagnosis not present

## 2017-09-05 DIAGNOSIS — I10 Essential (primary) hypertension: Secondary | ICD-10-CM | POA: Diagnosis not present

## 2017-09-12 DIAGNOSIS — M5136 Other intervertebral disc degeneration, lumbar region: Secondary | ICD-10-CM | POA: Diagnosis not present

## 2017-09-22 DIAGNOSIS — M205X2 Other deformities of toe(s) (acquired), left foot: Secondary | ICD-10-CM | POA: Diagnosis not present

## 2017-09-22 DIAGNOSIS — M79675 Pain in left toe(s): Secondary | ICD-10-CM | POA: Diagnosis not present

## 2017-09-22 DIAGNOSIS — L97521 Non-pressure chronic ulcer of other part of left foot limited to breakdown of skin: Secondary | ICD-10-CM | POA: Diagnosis not present

## 2017-09-26 DIAGNOSIS — M25519 Pain in unspecified shoulder: Secondary | ICD-10-CM | POA: Diagnosis not present

## 2017-09-26 DIAGNOSIS — R918 Other nonspecific abnormal finding of lung field: Secondary | ICD-10-CM | POA: Diagnosis not present

## 2017-09-26 DIAGNOSIS — R079 Chest pain, unspecified: Secondary | ICD-10-CM | POA: Diagnosis not present

## 2017-09-26 DIAGNOSIS — I10 Essential (primary) hypertension: Secondary | ICD-10-CM | POA: Diagnosis not present

## 2017-09-26 DIAGNOSIS — R0789 Other chest pain: Secondary | ICD-10-CM | POA: Diagnosis not present

## 2017-09-26 DIAGNOSIS — M25512 Pain in left shoulder: Secondary | ICD-10-CM | POA: Diagnosis not present

## 2017-10-05 DIAGNOSIS — Z853 Personal history of malignant neoplasm of breast: Secondary | ICD-10-CM | POA: Diagnosis not present

## 2017-10-05 DIAGNOSIS — R928 Other abnormal and inconclusive findings on diagnostic imaging of breast: Secondary | ICD-10-CM | POA: Diagnosis not present

## 2017-10-05 DIAGNOSIS — C50412 Malignant neoplasm of upper-outer quadrant of left female breast: Secondary | ICD-10-CM | POA: Diagnosis not present

## 2017-10-06 ENCOUNTER — Other Ambulatory Visit: Payer: Self-pay

## 2017-10-06 DIAGNOSIS — M19012 Primary osteoarthritis, left shoulder: Secondary | ICD-10-CM | POA: Diagnosis not present

## 2017-10-06 DIAGNOSIS — L03116 Cellulitis of left lower limb: Secondary | ICD-10-CM | POA: Diagnosis not present

## 2017-10-14 DIAGNOSIS — M25512 Pain in left shoulder: Secondary | ICD-10-CM | POA: Diagnosis not present

## 2017-10-18 DIAGNOSIS — Z6831 Body mass index (BMI) 31.0-31.9, adult: Secondary | ICD-10-CM | POA: Diagnosis not present

## 2017-10-18 DIAGNOSIS — M79604 Pain in right leg: Secondary | ICD-10-CM | POA: Diagnosis not present

## 2017-10-18 DIAGNOSIS — M79605 Pain in left leg: Secondary | ICD-10-CM | POA: Diagnosis not present

## 2017-10-18 DIAGNOSIS — I872 Venous insufficiency (chronic) (peripheral): Secondary | ICD-10-CM | POA: Diagnosis not present

## 2017-11-01 DIAGNOSIS — M79605 Pain in left leg: Secondary | ICD-10-CM | POA: Diagnosis not present

## 2017-11-01 DIAGNOSIS — M79604 Pain in right leg: Secondary | ICD-10-CM | POA: Diagnosis not present

## 2017-11-01 DIAGNOSIS — M79661 Pain in right lower leg: Secondary | ICD-10-CM | POA: Diagnosis not present

## 2017-11-01 DIAGNOSIS — M79662 Pain in left lower leg: Secondary | ICD-10-CM | POA: Diagnosis not present

## 2017-11-01 DIAGNOSIS — I872 Venous insufficiency (chronic) (peripheral): Secondary | ICD-10-CM | POA: Diagnosis not present

## 2017-11-16 DIAGNOSIS — M7581 Other shoulder lesions, right shoulder: Secondary | ICD-10-CM | POA: Diagnosis not present

## 2017-11-23 DIAGNOSIS — M545 Low back pain: Secondary | ICD-10-CM | POA: Diagnosis not present

## 2017-11-24 DIAGNOSIS — L84 Corns and callosities: Secondary | ICD-10-CM | POA: Diagnosis not present

## 2017-11-24 DIAGNOSIS — M205X2 Other deformities of toe(s) (acquired), left foot: Secondary | ICD-10-CM | POA: Diagnosis not present

## 2017-12-02 DIAGNOSIS — Z23 Encounter for immunization: Secondary | ICD-10-CM | POA: Diagnosis not present

## 2018-01-17 DIAGNOSIS — Z1331 Encounter for screening for depression: Secondary | ICD-10-CM | POA: Diagnosis not present

## 2018-01-17 DIAGNOSIS — Z6831 Body mass index (BMI) 31.0-31.9, adult: Secondary | ICD-10-CM | POA: Diagnosis not present

## 2018-01-17 DIAGNOSIS — Z79899 Other long term (current) drug therapy: Secondary | ICD-10-CM | POA: Diagnosis not present

## 2018-01-17 DIAGNOSIS — M549 Dorsalgia, unspecified: Secondary | ICD-10-CM | POA: Diagnosis not present

## 2018-01-17 DIAGNOSIS — E559 Vitamin D deficiency, unspecified: Secondary | ICD-10-CM | POA: Diagnosis not present

## 2018-01-17 DIAGNOSIS — M810 Age-related osteoporosis without current pathological fracture: Secondary | ICD-10-CM | POA: Diagnosis not present

## 2018-01-17 DIAGNOSIS — D509 Iron deficiency anemia, unspecified: Secondary | ICD-10-CM | POA: Diagnosis not present

## 2018-01-17 DIAGNOSIS — I10 Essential (primary) hypertension: Secondary | ICD-10-CM | POA: Diagnosis not present

## 2018-01-17 DIAGNOSIS — E669 Obesity, unspecified: Secondary | ICD-10-CM | POA: Diagnosis not present

## 2018-01-17 DIAGNOSIS — Z1339 Encounter for screening examination for other mental health and behavioral disorders: Secondary | ICD-10-CM | POA: Diagnosis not present

## 2018-01-17 DIAGNOSIS — I872 Venous insufficiency (chronic) (peripheral): Secondary | ICD-10-CM | POA: Diagnosis not present

## 2018-01-17 DIAGNOSIS — Z0001 Encounter for general adult medical examination with abnormal findings: Secondary | ICD-10-CM | POA: Diagnosis not present

## 2018-01-19 DIAGNOSIS — Z79899 Other long term (current) drug therapy: Secondary | ICD-10-CM | POA: Diagnosis not present

## 2018-01-19 DIAGNOSIS — I10 Essential (primary) hypertension: Secondary | ICD-10-CM | POA: Diagnosis not present

## 2018-01-19 DIAGNOSIS — D509 Iron deficiency anemia, unspecified: Secondary | ICD-10-CM | POA: Diagnosis not present

## 2018-01-19 DIAGNOSIS — E559 Vitamin D deficiency, unspecified: Secondary | ICD-10-CM | POA: Diagnosis not present

## 2018-01-19 DIAGNOSIS — M810 Age-related osteoporosis without current pathological fracture: Secondary | ICD-10-CM | POA: Diagnosis not present

## 2018-01-24 ENCOUNTER — Other Ambulatory Visit: Payer: Self-pay

## 2018-01-31 DIAGNOSIS — L84 Corns and callosities: Secondary | ICD-10-CM | POA: Diagnosis not present

## 2018-01-31 DIAGNOSIS — M205X2 Other deformities of toe(s) (acquired), left foot: Secondary | ICD-10-CM | POA: Diagnosis not present

## 2018-03-07 DIAGNOSIS — Z79811 Long term (current) use of aromatase inhibitors: Secondary | ICD-10-CM | POA: Diagnosis not present

## 2018-03-07 DIAGNOSIS — Z853 Personal history of malignant neoplasm of breast: Secondary | ICD-10-CM | POA: Diagnosis not present

## 2018-03-20 DIAGNOSIS — E876 Hypokalemia: Secondary | ICD-10-CM | POA: Diagnosis not present

## 2018-05-04 DIAGNOSIS — M205X2 Other deformities of toe(s) (acquired), left foot: Secondary | ICD-10-CM | POA: Diagnosis not present

## 2018-05-04 DIAGNOSIS — M79675 Pain in left toe(s): Secondary | ICD-10-CM | POA: Diagnosis not present

## 2018-05-04 DIAGNOSIS — L84 Corns and callosities: Secondary | ICD-10-CM | POA: Diagnosis not present

## 2018-05-15 DIAGNOSIS — M7582 Other shoulder lesions, left shoulder: Secondary | ICD-10-CM | POA: Diagnosis not present

## 2018-05-25 DIAGNOSIS — G4700 Insomnia, unspecified: Secondary | ICD-10-CM | POA: Diagnosis not present

## 2018-05-25 DIAGNOSIS — M47816 Spondylosis without myelopathy or radiculopathy, lumbar region: Secondary | ICD-10-CM | POA: Diagnosis not present

## 2018-05-25 DIAGNOSIS — Z6831 Body mass index (BMI) 31.0-31.9, adult: Secondary | ICD-10-CM | POA: Diagnosis not present

## 2018-05-25 DIAGNOSIS — G603 Idiopathic progressive neuropathy: Secondary | ICD-10-CM | POA: Diagnosis not present

## 2018-05-25 DIAGNOSIS — I10 Essential (primary) hypertension: Secondary | ICD-10-CM | POA: Diagnosis not present

## 2018-08-03 DIAGNOSIS — M79675 Pain in left toe(s): Secondary | ICD-10-CM | POA: Diagnosis not present

## 2018-08-03 DIAGNOSIS — M205X2 Other deformities of toe(s) (acquired), left foot: Secondary | ICD-10-CM | POA: Diagnosis not present

## 2018-08-03 DIAGNOSIS — L84 Corns and callosities: Secondary | ICD-10-CM | POA: Diagnosis not present

## 2018-08-28 DIAGNOSIS — Z6829 Body mass index (BMI) 29.0-29.9, adult: Secondary | ICD-10-CM | POA: Diagnosis not present

## 2018-08-28 DIAGNOSIS — R1031 Right lower quadrant pain: Secondary | ICD-10-CM | POA: Diagnosis not present

## 2018-08-28 DIAGNOSIS — Z79899 Other long term (current) drug therapy: Secondary | ICD-10-CM | POA: Diagnosis not present

## 2018-08-28 DIAGNOSIS — I10 Essential (primary) hypertension: Secondary | ICD-10-CM | POA: Diagnosis not present

## 2018-08-28 DIAGNOSIS — G603 Idiopathic progressive neuropathy: Secondary | ICD-10-CM | POA: Diagnosis not present

## 2018-08-28 DIAGNOSIS — G4700 Insomnia, unspecified: Secondary | ICD-10-CM | POA: Diagnosis not present

## 2018-08-28 DIAGNOSIS — M47816 Spondylosis without myelopathy or radiculopathy, lumbar region: Secondary | ICD-10-CM | POA: Diagnosis not present

## 2018-10-20 DIAGNOSIS — M659 Synovitis and tenosynovitis, unspecified: Secondary | ICD-10-CM | POA: Diagnosis not present

## 2018-10-20 DIAGNOSIS — M25562 Pain in left knee: Secondary | ICD-10-CM | POA: Diagnosis not present

## 2018-10-20 DIAGNOSIS — M25551 Pain in right hip: Secondary | ICD-10-CM | POA: Diagnosis not present

## 2018-10-20 DIAGNOSIS — G8929 Other chronic pain: Secondary | ICD-10-CM | POA: Diagnosis not present

## 2018-10-20 DIAGNOSIS — M7061 Trochanteric bursitis, right hip: Secondary | ICD-10-CM | POA: Diagnosis not present

## 2018-10-31 DIAGNOSIS — G609 Hereditary and idiopathic neuropathy, unspecified: Secondary | ICD-10-CM | POA: Diagnosis not present

## 2018-10-31 DIAGNOSIS — L84 Corns and callosities: Secondary | ICD-10-CM | POA: Diagnosis not present

## 2018-10-31 DIAGNOSIS — M205X2 Other deformities of toe(s) (acquired), left foot: Secondary | ICD-10-CM | POA: Diagnosis not present

## 2018-11-07 DIAGNOSIS — M545 Low back pain: Secondary | ICD-10-CM | POA: Diagnosis not present

## 2018-12-02 DIAGNOSIS — Z23 Encounter for immunization: Secondary | ICD-10-CM | POA: Diagnosis not present

## 2018-12-11 DIAGNOSIS — M1710 Unilateral primary osteoarthritis, unspecified knee: Secondary | ICD-10-CM | POA: Diagnosis not present

## 2018-12-11 DIAGNOSIS — G603 Idiopathic progressive neuropathy: Secondary | ICD-10-CM | POA: Diagnosis not present

## 2018-12-11 DIAGNOSIS — M47816 Spondylosis without myelopathy or radiculopathy, lumbar region: Secondary | ICD-10-CM | POA: Diagnosis not present

## 2018-12-11 DIAGNOSIS — Z6827 Body mass index (BMI) 27.0-27.9, adult: Secondary | ICD-10-CM | POA: Diagnosis not present

## 2018-12-29 DIAGNOSIS — N390 Urinary tract infection, site not specified: Secondary | ICD-10-CM | POA: Diagnosis not present

## 2019-01-15 DIAGNOSIS — G894 Chronic pain syndrome: Secondary | ICD-10-CM | POA: Diagnosis not present

## 2019-01-15 DIAGNOSIS — M47816 Spondylosis without myelopathy or radiculopathy, lumbar region: Secondary | ICD-10-CM | POA: Diagnosis not present

## 2019-01-15 DIAGNOSIS — M5416 Radiculopathy, lumbar region: Secondary | ICD-10-CM | POA: Diagnosis not present

## 2019-01-15 DIAGNOSIS — M961 Postlaminectomy syndrome, not elsewhere classified: Secondary | ICD-10-CM | POA: Diagnosis not present

## 2019-01-23 DIAGNOSIS — M810 Age-related osteoporosis without current pathological fracture: Secondary | ICD-10-CM | POA: Diagnosis not present

## 2019-01-23 DIAGNOSIS — D509 Iron deficiency anemia, unspecified: Secondary | ICD-10-CM | POA: Diagnosis not present

## 2019-01-23 DIAGNOSIS — Z79899 Other long term (current) drug therapy: Secondary | ICD-10-CM | POA: Diagnosis not present

## 2019-01-23 DIAGNOSIS — E785 Hyperlipidemia, unspecified: Secondary | ICD-10-CM | POA: Diagnosis not present

## 2019-01-23 DIAGNOSIS — I10 Essential (primary) hypertension: Secondary | ICD-10-CM | POA: Diagnosis not present

## 2019-02-06 DIAGNOSIS — M205X2 Other deformities of toe(s) (acquired), left foot: Secondary | ICD-10-CM | POA: Diagnosis not present

## 2019-02-06 DIAGNOSIS — G609 Hereditary and idiopathic neuropathy, unspecified: Secondary | ICD-10-CM | POA: Diagnosis not present

## 2019-02-06 DIAGNOSIS — L84 Corns and callosities: Secondary | ICD-10-CM | POA: Diagnosis not present

## 2019-02-06 DIAGNOSIS — M79675 Pain in left toe(s): Secondary | ICD-10-CM | POA: Diagnosis not present

## 2019-02-08 DIAGNOSIS — M48061 Spinal stenosis, lumbar region without neurogenic claudication: Secondary | ICD-10-CM | POA: Diagnosis not present

## 2019-02-08 DIAGNOSIS — M5416 Radiculopathy, lumbar region: Secondary | ICD-10-CM | POA: Diagnosis not present

## 2019-02-21 DIAGNOSIS — M25559 Pain in unspecified hip: Secondary | ICD-10-CM | POA: Diagnosis not present

## 2019-02-21 DIAGNOSIS — M549 Dorsalgia, unspecified: Secondary | ICD-10-CM | POA: Diagnosis not present

## 2019-02-21 DIAGNOSIS — G8929 Other chronic pain: Secondary | ICD-10-CM | POA: Diagnosis not present

## 2019-02-21 DIAGNOSIS — R296 Repeated falls: Secondary | ICD-10-CM | POA: Diagnosis not present

## 2019-02-21 DIAGNOSIS — Z8673 Personal history of transient ischemic attack (TIA), and cerebral infarction without residual deficits: Secondary | ICD-10-CM | POA: Diagnosis not present

## 2019-02-21 DIAGNOSIS — I10 Essential (primary) hypertension: Secondary | ICD-10-CM | POA: Diagnosis not present

## 2019-02-21 DIAGNOSIS — M79669 Pain in unspecified lower leg: Secondary | ICD-10-CM | POA: Diagnosis not present

## 2019-02-21 DIAGNOSIS — M419 Scoliosis, unspecified: Secondary | ICD-10-CM | POA: Diagnosis not present

## 2019-03-20 DIAGNOSIS — Z6827 Body mass index (BMI) 27.0-27.9, adult: Secondary | ICD-10-CM | POA: Diagnosis not present

## 2019-03-20 DIAGNOSIS — M2550 Pain in unspecified joint: Secondary | ICD-10-CM | POA: Diagnosis not present

## 2019-03-20 DIAGNOSIS — F5101 Primary insomnia: Secondary | ICD-10-CM | POA: Diagnosis not present

## 2019-03-20 DIAGNOSIS — D649 Anemia, unspecified: Secondary | ICD-10-CM | POA: Diagnosis not present

## 2019-05-01 DIAGNOSIS — N3000 Acute cystitis without hematuria: Secondary | ICD-10-CM | POA: Diagnosis not present

## 2019-05-22 DIAGNOSIS — I10 Essential (primary) hypertension: Secondary | ICD-10-CM | POA: Diagnosis not present

## 2019-05-22 DIAGNOSIS — M549 Dorsalgia, unspecified: Secondary | ICD-10-CM | POA: Diagnosis not present

## 2019-05-22 DIAGNOSIS — R296 Repeated falls: Secondary | ICD-10-CM | POA: Diagnosis not present

## 2019-05-22 DIAGNOSIS — M79669 Pain in unspecified lower leg: Secondary | ICD-10-CM | POA: Diagnosis not present

## 2019-05-22 DIAGNOSIS — M419 Scoliosis, unspecified: Secondary | ICD-10-CM | POA: Diagnosis not present

## 2019-05-22 DIAGNOSIS — M25559 Pain in unspecified hip: Secondary | ICD-10-CM | POA: Diagnosis not present

## 2019-05-22 DIAGNOSIS — G8929 Other chronic pain: Secondary | ICD-10-CM | POA: Diagnosis not present

## 2019-06-05 DIAGNOSIS — M205X2 Other deformities of toe(s) (acquired), left foot: Secondary | ICD-10-CM | POA: Diagnosis not present

## 2019-06-05 DIAGNOSIS — L84 Corns and callosities: Secondary | ICD-10-CM | POA: Diagnosis not present

## 2019-06-05 DIAGNOSIS — G609 Hereditary and idiopathic neuropathy, unspecified: Secondary | ICD-10-CM | POA: Diagnosis not present

## 2019-06-28 DIAGNOSIS — N390 Urinary tract infection, site not specified: Secondary | ICD-10-CM | POA: Diagnosis not present

## 2019-07-04 DIAGNOSIS — G4700 Insomnia, unspecified: Secondary | ICD-10-CM | POA: Diagnosis not present

## 2019-07-04 DIAGNOSIS — M25551 Pain in right hip: Secondary | ICD-10-CM | POA: Diagnosis not present

## 2019-07-04 DIAGNOSIS — M47816 Spondylosis without myelopathy or radiculopathy, lumbar region: Secondary | ICD-10-CM | POA: Diagnosis not present

## 2019-07-04 DIAGNOSIS — Z6826 Body mass index (BMI) 26.0-26.9, adult: Secondary | ICD-10-CM | POA: Diagnosis not present

## 2019-07-04 DIAGNOSIS — Z1331 Encounter for screening for depression: Secondary | ICD-10-CM | POA: Diagnosis not present

## 2019-07-04 DIAGNOSIS — M545 Low back pain: Secondary | ICD-10-CM | POA: Diagnosis not present

## 2019-07-04 DIAGNOSIS — M419 Scoliosis, unspecified: Secondary | ICD-10-CM | POA: Diagnosis not present

## 2019-08-09 DIAGNOSIS — D509 Iron deficiency anemia, unspecified: Secondary | ICD-10-CM | POA: Diagnosis not present

## 2019-08-09 DIAGNOSIS — I10 Essential (primary) hypertension: Secondary | ICD-10-CM | POA: Diagnosis not present

## 2019-08-09 DIAGNOSIS — Z79899 Other long term (current) drug therapy: Secondary | ICD-10-CM | POA: Diagnosis not present

## 2019-08-09 DIAGNOSIS — W19XXXA Unspecified fall, initial encounter: Secondary | ICD-10-CM | POA: Diagnosis not present

## 2019-09-17 DIAGNOSIS — D509 Iron deficiency anemia, unspecified: Secondary | ICD-10-CM | POA: Diagnosis not present

## 2019-09-17 DIAGNOSIS — M25551 Pain in right hip: Secondary | ICD-10-CM | POA: Diagnosis not present

## 2019-09-17 DIAGNOSIS — M7551 Bursitis of right shoulder: Secondary | ICD-10-CM | POA: Diagnosis not present

## 2019-10-04 DIAGNOSIS — M205X2 Other deformities of toe(s) (acquired), left foot: Secondary | ICD-10-CM | POA: Diagnosis not present

## 2019-10-04 DIAGNOSIS — Z1211 Encounter for screening for malignant neoplasm of colon: Secondary | ICD-10-CM | POA: Diagnosis not present

## 2019-10-04 DIAGNOSIS — L97521 Non-pressure chronic ulcer of other part of left foot limited to breakdown of skin: Secondary | ICD-10-CM | POA: Diagnosis not present

## 2019-10-30 DIAGNOSIS — N3000 Acute cystitis without hematuria: Secondary | ICD-10-CM | POA: Diagnosis not present

## 2019-10-30 DIAGNOSIS — R300 Dysuria: Secondary | ICD-10-CM | POA: Diagnosis not present

## 2019-10-30 DIAGNOSIS — N3090 Cystitis, unspecified without hematuria: Secondary | ICD-10-CM | POA: Diagnosis not present

## 2019-11-16 DIAGNOSIS — L304 Erythema intertrigo: Secondary | ICD-10-CM | POA: Diagnosis not present

## 2019-11-16 DIAGNOSIS — Z79899 Other long term (current) drug therapy: Secondary | ICD-10-CM | POA: Diagnosis not present

## 2019-11-16 DIAGNOSIS — G603 Idiopathic progressive neuropathy: Secondary | ICD-10-CM | POA: Diagnosis not present

## 2019-11-16 DIAGNOSIS — G4700 Insomnia, unspecified: Secondary | ICD-10-CM | POA: Diagnosis not present

## 2019-11-16 DIAGNOSIS — I10 Essential (primary) hypertension: Secondary | ICD-10-CM | POA: Diagnosis not present

## 2019-11-16 DIAGNOSIS — N390 Urinary tract infection, site not specified: Secondary | ICD-10-CM | POA: Diagnosis not present

## 2019-11-16 DIAGNOSIS — D509 Iron deficiency anemia, unspecified: Secondary | ICD-10-CM | POA: Diagnosis not present

## 2019-11-16 DIAGNOSIS — M47816 Spondylosis without myelopathy or radiculopathy, lumbar region: Secondary | ICD-10-CM | POA: Diagnosis not present

## 2019-11-16 DIAGNOSIS — E785 Hyperlipidemia, unspecified: Secondary | ICD-10-CM | POA: Diagnosis not present

## 2019-11-16 DIAGNOSIS — R202 Paresthesia of skin: Secondary | ICD-10-CM | POA: Diagnosis not present

## 2019-11-17 DIAGNOSIS — Z23 Encounter for immunization: Secondary | ICD-10-CM | POA: Diagnosis not present

## 2019-11-18 DIAGNOSIS — I10 Essential (primary) hypertension: Secondary | ICD-10-CM | POA: Diagnosis not present

## 2019-11-18 DIAGNOSIS — G8929 Other chronic pain: Secondary | ICD-10-CM | POA: Diagnosis not present

## 2019-11-18 DIAGNOSIS — M549 Dorsalgia, unspecified: Secondary | ICD-10-CM | POA: Diagnosis not present

## 2019-11-18 DIAGNOSIS — R296 Repeated falls: Secondary | ICD-10-CM | POA: Diagnosis not present

## 2019-11-18 DIAGNOSIS — M419 Scoliosis, unspecified: Secondary | ICD-10-CM | POA: Diagnosis not present

## 2019-11-18 DIAGNOSIS — M25559 Pain in unspecified hip: Secondary | ICD-10-CM | POA: Diagnosis not present

## 2019-11-18 DIAGNOSIS — M79669 Pain in unspecified lower leg: Secondary | ICD-10-CM | POA: Diagnosis not present

## 2019-12-07 DIAGNOSIS — R700 Elevated erythrocyte sedimentation rate: Secondary | ICD-10-CM | POA: Diagnosis not present

## 2019-12-15 DIAGNOSIS — Z23 Encounter for immunization: Secondary | ICD-10-CM | POA: Diagnosis not present

## 2019-12-31 DIAGNOSIS — R1032 Left lower quadrant pain: Secondary | ICD-10-CM | POA: Diagnosis not present

## 2019-12-31 DIAGNOSIS — N3281 Overactive bladder: Secondary | ICD-10-CM | POA: Diagnosis not present

## 2019-12-31 DIAGNOSIS — Z87448 Personal history of other diseases of urinary system: Secondary | ICD-10-CM | POA: Diagnosis not present

## 2019-12-31 DIAGNOSIS — R1031 Right lower quadrant pain: Secondary | ICD-10-CM | POA: Diagnosis not present

## 2019-12-31 DIAGNOSIS — N281 Cyst of kidney, acquired: Secondary | ICD-10-CM | POA: Diagnosis not present

## 2019-12-31 DIAGNOSIS — R3982 Chronic bladder pain: Secondary | ICD-10-CM | POA: Diagnosis not present

## 2019-12-31 DIAGNOSIS — Z87442 Personal history of urinary calculi: Secondary | ICD-10-CM | POA: Diagnosis not present

## 2019-12-31 DIAGNOSIS — N390 Urinary tract infection, site not specified: Secondary | ICD-10-CM | POA: Diagnosis not present

## 2019-12-31 DIAGNOSIS — N952 Postmenopausal atrophic vaginitis: Secondary | ICD-10-CM | POA: Diagnosis not present

## 2019-12-31 DIAGNOSIS — Z79899 Other long term (current) drug therapy: Secondary | ICD-10-CM | POA: Diagnosis not present

## 2020-01-12 DIAGNOSIS — Z23 Encounter for immunization: Secondary | ICD-10-CM | POA: Diagnosis not present

## 2020-01-24 DIAGNOSIS — G629 Polyneuropathy, unspecified: Secondary | ICD-10-CM | POA: Diagnosis not present

## 2020-01-24 DIAGNOSIS — L97522 Non-pressure chronic ulcer of other part of left foot with fat layer exposed: Secondary | ICD-10-CM | POA: Diagnosis not present

## 2020-01-28 DIAGNOSIS — L97522 Non-pressure chronic ulcer of other part of left foot with fat layer exposed: Secondary | ICD-10-CM | POA: Diagnosis not present

## 2020-01-28 DIAGNOSIS — G629 Polyneuropathy, unspecified: Secondary | ICD-10-CM | POA: Diagnosis not present

## 2020-01-30 DIAGNOSIS — R1031 Right lower quadrant pain: Secondary | ICD-10-CM | POA: Diagnosis not present

## 2020-01-30 DIAGNOSIS — R1030 Lower abdominal pain, unspecified: Secondary | ICD-10-CM | POA: Diagnosis not present

## 2020-01-30 DIAGNOSIS — N200 Calculus of kidney: Secondary | ICD-10-CM | POA: Diagnosis not present

## 2020-01-30 DIAGNOSIS — K8020 Calculus of gallbladder without cholecystitis without obstruction: Secondary | ICD-10-CM | POA: Diagnosis not present

## 2020-01-30 DIAGNOSIS — N281 Cyst of kidney, acquired: Secondary | ICD-10-CM | POA: Diagnosis not present

## 2020-01-30 DIAGNOSIS — R3982 Chronic bladder pain: Secondary | ICD-10-CM | POA: Diagnosis not present

## 2020-01-30 DIAGNOSIS — R1032 Left lower quadrant pain: Secondary | ICD-10-CM | POA: Diagnosis not present

## 2020-02-07 DIAGNOSIS — B373 Candidiasis of vulva and vagina: Secondary | ICD-10-CM | POA: Diagnosis not present

## 2020-02-07 DIAGNOSIS — N281 Cyst of kidney, acquired: Secondary | ICD-10-CM | POA: Diagnosis not present

## 2020-02-07 DIAGNOSIS — L97522 Non-pressure chronic ulcer of other part of left foot with fat layer exposed: Secondary | ICD-10-CM | POA: Diagnosis not present

## 2020-02-07 DIAGNOSIS — N390 Urinary tract infection, site not specified: Secondary | ICD-10-CM | POA: Diagnosis not present

## 2020-02-07 DIAGNOSIS — R1031 Right lower quadrant pain: Secondary | ICD-10-CM | POA: Diagnosis not present

## 2020-02-07 DIAGNOSIS — R3982 Chronic bladder pain: Secondary | ICD-10-CM | POA: Diagnosis not present

## 2020-02-11 DIAGNOSIS — Z6827 Body mass index (BMI) 27.0-27.9, adult: Secondary | ICD-10-CM | POA: Diagnosis not present

## 2020-02-11 DIAGNOSIS — F5101 Primary insomnia: Secondary | ICD-10-CM | POA: Diagnosis not present

## 2020-02-11 DIAGNOSIS — M47816 Spondylosis without myelopathy or radiculopathy, lumbar region: Secondary | ICD-10-CM | POA: Diagnosis not present

## 2020-02-11 DIAGNOSIS — I10 Essential (primary) hypertension: Secondary | ICD-10-CM | POA: Diagnosis not present

## 2020-03-04 DIAGNOSIS — M5416 Radiculopathy, lumbar region: Secondary | ICD-10-CM | POA: Diagnosis not present

## 2020-03-04 DIAGNOSIS — M47817 Spondylosis without myelopathy or radiculopathy, lumbosacral region: Secondary | ICD-10-CM | POA: Diagnosis not present

## 2020-04-07 DIAGNOSIS — L97512 Non-pressure chronic ulcer of other part of right foot with fat layer exposed: Secondary | ICD-10-CM | POA: Diagnosis not present

## 2020-04-07 DIAGNOSIS — G629 Polyneuropathy, unspecified: Secondary | ICD-10-CM | POA: Diagnosis not present

## 2020-04-20 DIAGNOSIS — S79912A Unspecified injury of left hip, initial encounter: Secondary | ICD-10-CM | POA: Diagnosis not present

## 2020-04-20 DIAGNOSIS — G8929 Other chronic pain: Secondary | ICD-10-CM | POA: Diagnosis not present

## 2020-04-20 DIAGNOSIS — S32010D Wedge compression fracture of first lumbar vertebra, subsequent encounter for fracture with routine healing: Secondary | ICD-10-CM | POA: Diagnosis not present

## 2020-04-20 DIAGNOSIS — Z853 Personal history of malignant neoplasm of breast: Secondary | ICD-10-CM | POA: Diagnosis not present

## 2020-04-20 DIAGNOSIS — C50919 Malignant neoplasm of unspecified site of unspecified female breast: Secondary | ICD-10-CM | POA: Diagnosis not present

## 2020-04-20 DIAGNOSIS — G9389 Other specified disorders of brain: Secondary | ICD-10-CM | POA: Diagnosis not present

## 2020-04-20 DIAGNOSIS — M47816 Spondylosis without myelopathy or radiculopathy, lumbar region: Secondary | ICD-10-CM | POA: Diagnosis not present

## 2020-04-20 DIAGNOSIS — M549 Dorsalgia, unspecified: Secondary | ICD-10-CM | POA: Diagnosis not present

## 2020-04-20 DIAGNOSIS — M79605 Pain in left leg: Secondary | ICD-10-CM | POA: Diagnosis not present

## 2020-04-20 DIAGNOSIS — R52 Pain, unspecified: Secondary | ICD-10-CM | POA: Diagnosis not present

## 2020-04-20 DIAGNOSIS — M545 Low back pain, unspecified: Secondary | ICD-10-CM | POA: Diagnosis not present

## 2020-04-20 DIAGNOSIS — G4489 Other headache syndrome: Secondary | ICD-10-CM | POA: Diagnosis not present

## 2020-04-20 DIAGNOSIS — Z743 Need for continuous supervision: Secondary | ICD-10-CM | POA: Diagnosis not present

## 2020-04-20 DIAGNOSIS — R0902 Hypoxemia: Secondary | ICD-10-CM | POA: Diagnosis not present

## 2020-04-20 DIAGNOSIS — M1990 Unspecified osteoarthritis, unspecified site: Secondary | ICD-10-CM | POA: Diagnosis not present

## 2020-04-20 DIAGNOSIS — I700 Atherosclerosis of aorta: Secondary | ICD-10-CM | POA: Diagnosis not present

## 2020-04-20 DIAGNOSIS — M4854XD Collapsed vertebra, not elsewhere classified, thoracic region, subsequent encounter for fracture with routine healing: Secondary | ICD-10-CM | POA: Diagnosis not present

## 2020-04-20 DIAGNOSIS — R279 Unspecified lack of coordination: Secondary | ICD-10-CM | POA: Diagnosis not present

## 2020-04-20 DIAGNOSIS — M79662 Pain in left lower leg: Secondary | ICD-10-CM | POA: Diagnosis not present

## 2020-04-20 DIAGNOSIS — Z96652 Presence of left artificial knee joint: Secondary | ICD-10-CM | POA: Diagnosis not present

## 2020-04-20 DIAGNOSIS — E876 Hypokalemia: Secondary | ICD-10-CM | POA: Diagnosis present

## 2020-04-20 DIAGNOSIS — W19XXXA Unspecified fall, initial encounter: Secondary | ICD-10-CM | POA: Diagnosis not present

## 2020-04-20 DIAGNOSIS — M810 Age-related osteoporosis without current pathological fracture: Secondary | ICD-10-CM | POA: Diagnosis not present

## 2020-04-20 DIAGNOSIS — M4856XA Collapsed vertebra, not elsewhere classified, lumbar region, initial encounter for fracture: Secondary | ICD-10-CM | POA: Diagnosis present

## 2020-04-20 DIAGNOSIS — K5900 Constipation, unspecified: Secondary | ICD-10-CM | POA: Diagnosis not present

## 2020-04-20 DIAGNOSIS — Z20822 Contact with and (suspected) exposure to covid-19: Secondary | ICD-10-CM | POA: Diagnosis present

## 2020-04-20 DIAGNOSIS — S0990XA Unspecified injury of head, initial encounter: Secondary | ICD-10-CM | POA: Diagnosis not present

## 2020-04-20 DIAGNOSIS — R4182 Altered mental status, unspecified: Secondary | ICD-10-CM | POA: Diagnosis not present

## 2020-04-20 DIAGNOSIS — Z9181 History of falling: Secondary | ICD-10-CM | POA: Diagnosis not present

## 2020-04-20 DIAGNOSIS — I10 Essential (primary) hypertension: Secondary | ICD-10-CM | POA: Diagnosis present

## 2020-04-20 DIAGNOSIS — R442 Other hallucinations: Secondary | ICD-10-CM | POA: Diagnosis not present

## 2020-04-20 DIAGNOSIS — S32010A Wedge compression fracture of first lumbar vertebra, initial encounter for closed fracture: Secondary | ICD-10-CM | POA: Diagnosis not present

## 2020-04-23 DIAGNOSIS — I491 Atrial premature depolarization: Secondary | ICD-10-CM | POA: Diagnosis not present

## 2020-04-23 DIAGNOSIS — M549 Dorsalgia, unspecified: Secondary | ICD-10-CM | POA: Diagnosis not present

## 2020-04-23 DIAGNOSIS — R4182 Altered mental status, unspecified: Secondary | ICD-10-CM | POA: Diagnosis not present

## 2020-04-23 DIAGNOSIS — R000 Tachycardia, unspecified: Secondary | ICD-10-CM | POA: Diagnosis not present

## 2020-04-23 DIAGNOSIS — Z7901 Long term (current) use of anticoagulants: Secondary | ICD-10-CM | POA: Diagnosis not present

## 2020-04-23 DIAGNOSIS — G4700 Insomnia, unspecified: Secondary | ICD-10-CM | POA: Diagnosis present

## 2020-04-23 DIAGNOSIS — K2980 Duodenitis without bleeding: Secondary | ICD-10-CM | POA: Diagnosis present

## 2020-04-23 DIAGNOSIS — R0789 Other chest pain: Secondary | ICD-10-CM | POA: Diagnosis not present

## 2020-04-23 DIAGNOSIS — E876 Hypokalemia: Secondary | ICD-10-CM | POA: Diagnosis not present

## 2020-04-23 DIAGNOSIS — K5900 Constipation, unspecified: Secondary | ICD-10-CM | POA: Diagnosis not present

## 2020-04-23 DIAGNOSIS — Z8673 Personal history of transient ischemic attack (TIA), and cerebral infarction without residual deficits: Secondary | ICD-10-CM | POA: Diagnosis not present

## 2020-04-23 DIAGNOSIS — I4891 Unspecified atrial fibrillation: Secondary | ICD-10-CM | POA: Diagnosis not present

## 2020-04-23 DIAGNOSIS — R52 Pain, unspecified: Secondary | ICD-10-CM | POA: Diagnosis not present

## 2020-04-23 DIAGNOSIS — I480 Paroxysmal atrial fibrillation: Secondary | ICD-10-CM | POA: Diagnosis present

## 2020-04-23 DIAGNOSIS — C50919 Malignant neoplasm of unspecified site of unspecified female breast: Secondary | ICD-10-CM | POA: Diagnosis not present

## 2020-04-23 DIAGNOSIS — G894 Chronic pain syndrome: Secondary | ICD-10-CM | POA: Diagnosis present

## 2020-04-23 DIAGNOSIS — Z20822 Contact with and (suspected) exposure to covid-19: Secondary | ICD-10-CM | POA: Diagnosis present

## 2020-04-23 DIAGNOSIS — K8020 Calculus of gallbladder without cholecystitis without obstruction: Secondary | ICD-10-CM | POA: Diagnosis present

## 2020-04-23 DIAGNOSIS — N179 Acute kidney failure, unspecified: Secondary | ICD-10-CM | POA: Diagnosis not present

## 2020-04-23 DIAGNOSIS — I272 Pulmonary hypertension, unspecified: Secondary | ICD-10-CM | POA: Diagnosis present

## 2020-04-23 DIAGNOSIS — M1990 Unspecified osteoarthritis, unspecified site: Secondary | ICD-10-CM | POA: Diagnosis not present

## 2020-04-23 DIAGNOSIS — I872 Venous insufficiency (chronic) (peripheral): Secondary | ICD-10-CM | POA: Diagnosis present

## 2020-04-23 DIAGNOSIS — G8929 Other chronic pain: Secondary | ICD-10-CM | POA: Diagnosis not present

## 2020-04-23 DIAGNOSIS — M4854XD Collapsed vertebra, not elsewhere classified, thoracic region, subsequent encounter for fracture with routine healing: Secondary | ICD-10-CM | POA: Diagnosis not present

## 2020-04-23 DIAGNOSIS — M810 Age-related osteoporosis without current pathological fracture: Secondary | ICD-10-CM | POA: Diagnosis present

## 2020-04-23 DIAGNOSIS — S32018A Other fracture of first lumbar vertebra, initial encounter for closed fracture: Secondary | ICD-10-CM | POA: Diagnosis present

## 2020-04-23 DIAGNOSIS — S32010A Wedge compression fracture of first lumbar vertebra, initial encounter for closed fracture: Secondary | ICD-10-CM | POA: Diagnosis not present

## 2020-04-23 DIAGNOSIS — D649 Anemia, unspecified: Secondary | ICD-10-CM | POA: Diagnosis not present

## 2020-04-23 DIAGNOSIS — R778 Other specified abnormalities of plasma proteins: Secondary | ICD-10-CM | POA: Diagnosis not present

## 2020-04-23 DIAGNOSIS — Z853 Personal history of malignant neoplasm of breast: Secondary | ICD-10-CM | POA: Diagnosis not present

## 2020-04-23 DIAGNOSIS — R110 Nausea: Secondary | ICD-10-CM | POA: Diagnosis not present

## 2020-04-23 DIAGNOSIS — R079 Chest pain, unspecified: Secondary | ICD-10-CM | POA: Diagnosis not present

## 2020-04-23 DIAGNOSIS — Z886 Allergy status to analgesic agent status: Secondary | ICD-10-CM | POA: Diagnosis not present

## 2020-04-23 DIAGNOSIS — E871 Hypo-osmolality and hyponatremia: Secondary | ICD-10-CM | POA: Diagnosis not present

## 2020-04-23 DIAGNOSIS — I10 Essential (primary) hypertension: Secondary | ICD-10-CM | POA: Diagnosis present

## 2020-04-23 DIAGNOSIS — J9811 Atelectasis: Secondary | ICD-10-CM | POA: Diagnosis not present

## 2020-04-23 DIAGNOSIS — T501X5A Adverse effect of loop [high-ceiling] diuretics, initial encounter: Secondary | ICD-10-CM | POA: Diagnosis not present

## 2020-04-23 DIAGNOSIS — M546 Pain in thoracic spine: Secondary | ICD-10-CM | POA: Diagnosis present

## 2020-04-23 DIAGNOSIS — R072 Precordial pain: Secondary | ICD-10-CM | POA: Diagnosis not present

## 2020-04-23 DIAGNOSIS — I495 Sick sinus syndrome: Secondary | ICD-10-CM | POA: Diagnosis present

## 2020-04-23 DIAGNOSIS — Z9181 History of falling: Secondary | ICD-10-CM | POA: Diagnosis not present

## 2020-04-23 DIAGNOSIS — R279 Unspecified lack of coordination: Secondary | ICD-10-CM | POA: Diagnosis not present

## 2020-04-23 DIAGNOSIS — M5416 Radiculopathy, lumbar region: Secondary | ICD-10-CM | POA: Diagnosis present

## 2020-04-23 DIAGNOSIS — S32010D Wedge compression fracture of first lumbar vertebra, subsequent encounter for fracture with routine healing: Secondary | ICD-10-CM | POA: Diagnosis not present

## 2020-04-23 DIAGNOSIS — K264 Chronic or unspecified duodenal ulcer with hemorrhage: Secondary | ICD-10-CM | POA: Diagnosis present

## 2020-04-23 DIAGNOSIS — R0602 Shortness of breath: Secondary | ICD-10-CM | POA: Diagnosis not present

## 2020-04-23 DIAGNOSIS — Z66 Do not resuscitate: Secondary | ICD-10-CM | POA: Diagnosis present

## 2020-04-23 DIAGNOSIS — R296 Repeated falls: Secondary | ICD-10-CM | POA: Diagnosis present

## 2020-04-23 DIAGNOSIS — I350 Nonrheumatic aortic (valve) stenosis: Secondary | ICD-10-CM | POA: Diagnosis present

## 2020-04-23 DIAGNOSIS — J90 Pleural effusion, not elsewhere classified: Secondary | ICD-10-CM | POA: Diagnosis not present

## 2020-04-23 DIAGNOSIS — Z743 Need for continuous supervision: Secondary | ICD-10-CM | POA: Diagnosis not present

## 2020-04-24 DIAGNOSIS — R0602 Shortness of breath: Secondary | ICD-10-CM | POA: Diagnosis not present

## 2020-04-24 DIAGNOSIS — R778 Other specified abnormalities of plasma proteins: Secondary | ICD-10-CM | POA: Diagnosis not present

## 2020-04-24 DIAGNOSIS — I480 Paroxysmal atrial fibrillation: Secondary | ICD-10-CM | POA: Diagnosis not present

## 2020-04-24 DIAGNOSIS — G894 Chronic pain syndrome: Secondary | ICD-10-CM | POA: Diagnosis not present

## 2020-04-24 DIAGNOSIS — Z66 Do not resuscitate: Secondary | ICD-10-CM | POA: Diagnosis not present

## 2020-04-24 DIAGNOSIS — R079 Chest pain, unspecified: Secondary | ICD-10-CM | POA: Diagnosis not present

## 2020-04-24 DIAGNOSIS — R072 Precordial pain: Secondary | ICD-10-CM | POA: Diagnosis not present

## 2020-04-24 DIAGNOSIS — J90 Pleural effusion, not elsewhere classified: Secondary | ICD-10-CM | POA: Diagnosis not present

## 2020-04-24 DIAGNOSIS — S32018A Other fracture of first lumbar vertebra, initial encounter for closed fracture: Secondary | ICD-10-CM | POA: Diagnosis not present

## 2020-04-24 DIAGNOSIS — I4891 Unspecified atrial fibrillation: Secondary | ICD-10-CM | POA: Diagnosis not present

## 2020-04-24 DIAGNOSIS — R0789 Other chest pain: Secondary | ICD-10-CM | POA: Diagnosis not present

## 2020-04-24 DIAGNOSIS — J9811 Atelectasis: Secondary | ICD-10-CM | POA: Diagnosis not present

## 2020-04-24 DIAGNOSIS — C50919 Malignant neoplasm of unspecified site of unspecified female breast: Secondary | ICD-10-CM | POA: Diagnosis not present

## 2020-04-24 DIAGNOSIS — Z20822 Contact with and (suspected) exposure to covid-19: Secondary | ICD-10-CM | POA: Diagnosis not present

## 2020-04-24 DIAGNOSIS — E871 Hypo-osmolality and hyponatremia: Secondary | ICD-10-CM | POA: Diagnosis not present

## 2020-04-24 DIAGNOSIS — Z853 Personal history of malignant neoplasm of breast: Secondary | ICD-10-CM | POA: Diagnosis not present

## 2020-04-24 DIAGNOSIS — I10 Essential (primary) hypertension: Secondary | ICD-10-CM | POA: Diagnosis not present

## 2020-04-24 DIAGNOSIS — R110 Nausea: Secondary | ICD-10-CM | POA: Diagnosis not present

## 2020-04-24 DIAGNOSIS — K264 Chronic or unspecified duodenal ulcer with hemorrhage: Secondary | ICD-10-CM | POA: Diagnosis not present

## 2020-04-25 DIAGNOSIS — D62 Acute posthemorrhagic anemia: Secondary | ICD-10-CM | POA: Diagnosis not present

## 2020-04-25 DIAGNOSIS — I350 Nonrheumatic aortic (valve) stenosis: Secondary | ICD-10-CM | POA: Diagnosis present

## 2020-04-25 DIAGNOSIS — Z66 Do not resuscitate: Secondary | ICD-10-CM | POA: Diagnosis present

## 2020-04-25 DIAGNOSIS — G894 Chronic pain syndrome: Secondary | ICD-10-CM | POA: Diagnosis present

## 2020-04-25 DIAGNOSIS — I319 Disease of pericardium, unspecified: Secondary | ICD-10-CM | POA: Diagnosis not present

## 2020-04-25 DIAGNOSIS — Z853 Personal history of malignant neoplasm of breast: Secondary | ICD-10-CM | POA: Diagnosis not present

## 2020-04-25 DIAGNOSIS — Z886 Allergy status to analgesic agent status: Secondary | ICD-10-CM | POA: Diagnosis not present

## 2020-04-25 DIAGNOSIS — I498 Other specified cardiac arrhythmias: Secondary | ICD-10-CM | POA: Diagnosis not present

## 2020-04-25 DIAGNOSIS — R9431 Abnormal electrocardiogram [ECG] [EKG]: Secondary | ICD-10-CM | POA: Diagnosis not present

## 2020-04-25 DIAGNOSIS — B3781 Candidal esophagitis: Secondary | ICD-10-CM | POA: Diagnosis not present

## 2020-04-25 DIAGNOSIS — E871 Hypo-osmolality and hyponatremia: Secondary | ICD-10-CM | POA: Diagnosis not present

## 2020-04-25 DIAGNOSIS — I493 Ventricular premature depolarization: Secondary | ICD-10-CM | POA: Diagnosis not present

## 2020-04-25 DIAGNOSIS — E43 Unspecified severe protein-calorie malnutrition: Secondary | ICD-10-CM | POA: Diagnosis not present

## 2020-04-25 DIAGNOSIS — I495 Sick sinus syndrome: Secondary | ICD-10-CM | POA: Diagnosis present

## 2020-04-25 DIAGNOSIS — K449 Diaphragmatic hernia without obstruction or gangrene: Secondary | ICD-10-CM | POA: Diagnosis not present

## 2020-04-25 DIAGNOSIS — I480 Paroxysmal atrial fibrillation: Secondary | ICD-10-CM | POA: Diagnosis present

## 2020-04-25 DIAGNOSIS — R072 Precordial pain: Secondary | ICD-10-CM | POA: Diagnosis not present

## 2020-04-25 DIAGNOSIS — K2980 Duodenitis without bleeding: Secondary | ICD-10-CM | POA: Diagnosis present

## 2020-04-25 DIAGNOSIS — Z8673 Personal history of transient ischemic attack (TIA), and cerebral infarction without residual deficits: Secondary | ICD-10-CM | POA: Diagnosis not present

## 2020-04-25 DIAGNOSIS — I272 Pulmonary hypertension, unspecified: Secondary | ICD-10-CM | POA: Diagnosis present

## 2020-04-25 DIAGNOSIS — S32018A Other fracture of first lumbar vertebra, initial encounter for closed fracture: Secondary | ICD-10-CM | POA: Diagnosis present

## 2020-04-25 DIAGNOSIS — S32010D Wedge compression fracture of first lumbar vertebra, subsequent encounter for fracture with routine healing: Secondary | ICD-10-CM | POA: Diagnosis not present

## 2020-04-25 DIAGNOSIS — R778 Other specified abnormalities of plasma proteins: Secondary | ICD-10-CM | POA: Diagnosis not present

## 2020-04-25 DIAGNOSIS — K269 Duodenal ulcer, unspecified as acute or chronic, without hemorrhage or perforation: Secondary | ICD-10-CM | POA: Diagnosis not present

## 2020-04-25 DIAGNOSIS — M810 Age-related osteoporosis without current pathological fracture: Secondary | ICD-10-CM | POA: Diagnosis present

## 2020-04-25 DIAGNOSIS — K2950 Unspecified chronic gastritis without bleeding: Secondary | ICD-10-CM | POA: Diagnosis not present

## 2020-04-25 DIAGNOSIS — R0789 Other chest pain: Secondary | ICD-10-CM | POA: Diagnosis not present

## 2020-04-25 DIAGNOSIS — G8929 Other chronic pain: Secondary | ICD-10-CM | POA: Diagnosis not present

## 2020-04-25 DIAGNOSIS — R195 Other fecal abnormalities: Secondary | ICD-10-CM | POA: Diagnosis not present

## 2020-04-25 DIAGNOSIS — Z7901 Long term (current) use of anticoagulants: Secondary | ICD-10-CM | POA: Diagnosis not present

## 2020-04-25 DIAGNOSIS — R630 Anorexia: Secondary | ICD-10-CM | POA: Diagnosis not present

## 2020-04-25 DIAGNOSIS — I872 Venous insufficiency (chronic) (peripheral): Secondary | ICD-10-CM | POA: Diagnosis present

## 2020-04-25 DIAGNOSIS — R110 Nausea: Secondary | ICD-10-CM | POA: Diagnosis not present

## 2020-04-25 DIAGNOSIS — R634 Abnormal weight loss: Secondary | ICD-10-CM | POA: Diagnosis not present

## 2020-04-25 DIAGNOSIS — M4856XD Collapsed vertebra, not elsewhere classified, lumbar region, subsequent encounter for fracture with routine healing: Secondary | ICD-10-CM | POA: Diagnosis not present

## 2020-04-25 DIAGNOSIS — M546 Pain in thoracic spine: Secondary | ICD-10-CM | POA: Diagnosis present

## 2020-04-25 DIAGNOSIS — Z743 Need for continuous supervision: Secondary | ICD-10-CM | POA: Diagnosis not present

## 2020-04-25 DIAGNOSIS — K8020 Calculus of gallbladder without cholecystitis without obstruction: Secondary | ICD-10-CM | POA: Diagnosis present

## 2020-04-25 DIAGNOSIS — I10 Essential (primary) hypertension: Secondary | ICD-10-CM | POA: Diagnosis present

## 2020-04-25 DIAGNOSIS — D649 Anemia, unspecified: Secondary | ICD-10-CM | POA: Diagnosis not present

## 2020-04-25 DIAGNOSIS — M4854XD Collapsed vertebra, not elsewhere classified, thoracic region, subsequent encounter for fracture with routine healing: Secondary | ICD-10-CM | POA: Diagnosis not present

## 2020-04-25 DIAGNOSIS — R1011 Right upper quadrant pain: Secondary | ICD-10-CM | POA: Diagnosis not present

## 2020-04-25 DIAGNOSIS — R509 Fever, unspecified: Secondary | ICD-10-CM | POA: Diagnosis not present

## 2020-04-25 DIAGNOSIS — G4700 Insomnia, unspecified: Secondary | ICD-10-CM | POA: Diagnosis present

## 2020-04-25 DIAGNOSIS — R279 Unspecified lack of coordination: Secondary | ICD-10-CM | POA: Diagnosis not present

## 2020-04-25 DIAGNOSIS — M5416 Radiculopathy, lumbar region: Secondary | ICD-10-CM | POA: Diagnosis present

## 2020-04-25 DIAGNOSIS — K5900 Constipation, unspecified: Secondary | ICD-10-CM | POA: Diagnosis not present

## 2020-04-25 DIAGNOSIS — K633 Ulcer of intestine: Secondary | ICD-10-CM | POA: Diagnosis not present

## 2020-04-25 DIAGNOSIS — R079 Chest pain, unspecified: Secondary | ICD-10-CM | POA: Diagnosis not present

## 2020-04-25 DIAGNOSIS — T501X5A Adverse effect of loop [high-ceiling] diuretics, initial encounter: Secondary | ICD-10-CM | POA: Diagnosis not present

## 2020-04-25 DIAGNOSIS — Z9181 History of falling: Secondary | ICD-10-CM | POA: Diagnosis not present

## 2020-04-25 DIAGNOSIS — N179 Acute kidney failure, unspecified: Secondary | ICD-10-CM | POA: Diagnosis not present

## 2020-04-25 DIAGNOSIS — R935 Abnormal findings on diagnostic imaging of other abdominal regions, including retroperitoneum: Secondary | ICD-10-CM | POA: Diagnosis not present

## 2020-04-25 DIAGNOSIS — K264 Chronic or unspecified duodenal ulcer with hemorrhage: Secondary | ICD-10-CM | POA: Diagnosis present

## 2020-04-25 DIAGNOSIS — I4891 Unspecified atrial fibrillation: Secondary | ICD-10-CM | POA: Diagnosis not present

## 2020-04-25 DIAGNOSIS — R109 Unspecified abdominal pain: Secondary | ICD-10-CM | POA: Diagnosis not present

## 2020-04-25 DIAGNOSIS — Z20822 Contact with and (suspected) exposure to covid-19: Secondary | ICD-10-CM | POA: Diagnosis present

## 2020-04-25 DIAGNOSIS — R296 Repeated falls: Secondary | ICD-10-CM | POA: Diagnosis present

## 2020-05-02 DIAGNOSIS — R079 Chest pain, unspecified: Secondary | ICD-10-CM | POA: Diagnosis not present

## 2020-05-02 DIAGNOSIS — Z743 Need for continuous supervision: Secondary | ICD-10-CM | POA: Diagnosis not present

## 2020-05-02 DIAGNOSIS — Z9181 History of falling: Secondary | ICD-10-CM | POA: Diagnosis not present

## 2020-05-02 DIAGNOSIS — E43 Unspecified severe protein-calorie malnutrition: Secondary | ICD-10-CM | POA: Diagnosis not present

## 2020-05-02 DIAGNOSIS — I959 Hypotension, unspecified: Secondary | ICD-10-CM | POA: Diagnosis not present

## 2020-05-02 DIAGNOSIS — I314 Cardiac tamponade: Secondary | ICD-10-CM | POA: Diagnosis not present

## 2020-05-02 DIAGNOSIS — I499 Cardiac arrhythmia, unspecified: Secondary | ICD-10-CM | POA: Diagnosis not present

## 2020-05-02 DIAGNOSIS — I272 Pulmonary hypertension, unspecified: Secondary | ICD-10-CM | POA: Diagnosis not present

## 2020-05-02 DIAGNOSIS — I110 Hypertensive heart disease with heart failure: Secondary | ICD-10-CM | POA: Diagnosis not present

## 2020-05-02 DIAGNOSIS — I10 Essential (primary) hypertension: Secondary | ICD-10-CM | POA: Diagnosis not present

## 2020-05-02 DIAGNOSIS — J811 Chronic pulmonary edema: Secondary | ICD-10-CM | POA: Diagnosis not present

## 2020-05-02 DIAGNOSIS — M810 Age-related osteoporosis without current pathological fracture: Secondary | ICD-10-CM | POA: Diagnosis not present

## 2020-05-02 DIAGNOSIS — Z66 Do not resuscitate: Secondary | ICD-10-CM | POA: Diagnosis not present

## 2020-05-02 DIAGNOSIS — R110 Nausea: Secondary | ICD-10-CM | POA: Diagnosis not present

## 2020-05-02 DIAGNOSIS — K8020 Calculus of gallbladder without cholecystitis without obstruction: Secondary | ICD-10-CM | POA: Diagnosis not present

## 2020-05-02 DIAGNOSIS — Z87828 Personal history of other (healed) physical injury and trauma: Secondary | ICD-10-CM | POA: Diagnosis not present

## 2020-05-02 DIAGNOSIS — J90 Pleural effusion, not elsewhere classified: Secondary | ICD-10-CM | POA: Diagnosis not present

## 2020-05-02 DIAGNOSIS — G4700 Insomnia, unspecified: Secondary | ICD-10-CM | POA: Diagnosis not present

## 2020-05-02 DIAGNOSIS — M4856XD Collapsed vertebra, not elsewhere classified, lumbar region, subsequent encounter for fracture with routine healing: Secondary | ICD-10-CM | POA: Diagnosis not present

## 2020-05-02 DIAGNOSIS — I350 Nonrheumatic aortic (valve) stenosis: Secondary | ICD-10-CM | POA: Diagnosis not present

## 2020-05-02 DIAGNOSIS — J9811 Atelectasis: Secondary | ICD-10-CM | POA: Diagnosis not present

## 2020-05-02 DIAGNOSIS — Z79899 Other long term (current) drug therapy: Secondary | ICD-10-CM | POA: Diagnosis not present

## 2020-05-02 DIAGNOSIS — Z20822 Contact with and (suspected) exposure to covid-19: Secondary | ICD-10-CM | POA: Diagnosis not present

## 2020-05-02 DIAGNOSIS — G894 Chronic pain syndrome: Secondary | ICD-10-CM | POA: Diagnosis not present

## 2020-05-02 DIAGNOSIS — R0789 Other chest pain: Secondary | ICD-10-CM | POA: Diagnosis not present

## 2020-05-02 DIAGNOSIS — S32010D Wedge compression fracture of first lumbar vertebra, subsequent encounter for fracture with routine healing: Secondary | ICD-10-CM | POA: Diagnosis not present

## 2020-05-02 DIAGNOSIS — R4182 Altered mental status, unspecified: Secondary | ICD-10-CM | POA: Diagnosis not present

## 2020-05-02 DIAGNOSIS — R0902 Hypoxemia: Secondary | ICD-10-CM | POA: Diagnosis not present

## 2020-05-02 DIAGNOSIS — Z853 Personal history of malignant neoplasm of breast: Secondary | ICD-10-CM | POA: Diagnosis not present

## 2020-05-02 DIAGNOSIS — D6489 Other specified anemias: Secondary | ICD-10-CM | POA: Diagnosis not present

## 2020-05-02 DIAGNOSIS — J9601 Acute respiratory failure with hypoxia: Secondary | ICD-10-CM | POA: Diagnosis not present

## 2020-05-02 DIAGNOSIS — G8929 Other chronic pain: Secondary | ICD-10-CM | POA: Diagnosis not present

## 2020-05-02 DIAGNOSIS — R279 Unspecified lack of coordination: Secondary | ICD-10-CM | POA: Diagnosis not present

## 2020-05-02 DIAGNOSIS — I4891 Unspecified atrial fibrillation: Secondary | ICD-10-CM | POA: Diagnosis not present

## 2020-05-02 DIAGNOSIS — I509 Heart failure, unspecified: Secondary | ICD-10-CM | POA: Diagnosis not present

## 2020-05-02 DIAGNOSIS — R000 Tachycardia, unspecified: Secondary | ICD-10-CM | POA: Diagnosis not present

## 2020-05-02 DIAGNOSIS — M4854XD Collapsed vertebra, not elsewhere classified, thoracic region, subsequent encounter for fracture with routine healing: Secondary | ICD-10-CM | POA: Diagnosis not present

## 2020-05-02 DIAGNOSIS — Z9889 Other specified postprocedural states: Secondary | ICD-10-CM | POA: Diagnosis not present

## 2020-05-02 DIAGNOSIS — Z515 Encounter for palliative care: Secondary | ICD-10-CM | POA: Diagnosis not present

## 2020-05-02 DIAGNOSIS — R0602 Shortness of breath: Secondary | ICD-10-CM | POA: Diagnosis not present

## 2020-05-02 DIAGNOSIS — K5900 Constipation, unspecified: Secondary | ICD-10-CM | POA: Diagnosis not present

## 2020-05-02 DIAGNOSIS — M1990 Unspecified osteoarthritis, unspecified site: Secondary | ICD-10-CM | POA: Diagnosis not present

## 2020-05-04 DIAGNOSIS — Z66 Do not resuscitate: Secondary | ICD-10-CM | POA: Diagnosis not present

## 2020-05-04 DIAGNOSIS — Z515 Encounter for palliative care: Secondary | ICD-10-CM | POA: Diagnosis not present

## 2020-05-04 DIAGNOSIS — J90 Pleural effusion, not elsewhere classified: Secondary | ICD-10-CM | POA: Diagnosis not present

## 2020-05-04 DIAGNOSIS — I4891 Unspecified atrial fibrillation: Secondary | ICD-10-CM | POA: Diagnosis not present

## 2020-05-04 DIAGNOSIS — J9811 Atelectasis: Secondary | ICD-10-CM | POA: Diagnosis not present

## 2020-05-04 DIAGNOSIS — I509 Heart failure, unspecified: Secondary | ICD-10-CM | POA: Diagnosis not present

## 2020-05-04 DIAGNOSIS — R0602 Shortness of breath: Secondary | ICD-10-CM | POA: Diagnosis not present

## 2020-05-04 DIAGNOSIS — Z20822 Contact with and (suspected) exposure to covid-19: Secondary | ICD-10-CM | POA: Diagnosis not present

## 2020-05-04 DIAGNOSIS — I314 Cardiac tamponade: Secondary | ICD-10-CM | POA: Diagnosis not present

## 2020-05-04 DIAGNOSIS — J811 Chronic pulmonary edema: Secondary | ICD-10-CM | POA: Diagnosis not present

## 2020-05-04 DIAGNOSIS — J9601 Acute respiratory failure with hypoxia: Secondary | ICD-10-CM | POA: Diagnosis not present

## 2020-05-05 DIAGNOSIS — G629 Polyneuropathy, unspecified: Secondary | ICD-10-CM | POA: Diagnosis not present

## 2020-05-05 DIAGNOSIS — I509 Heart failure, unspecified: Secondary | ICD-10-CM | POA: Diagnosis not present

## 2020-05-05 DIAGNOSIS — I4430 Unspecified atrioventricular block: Secondary | ICD-10-CM | POA: Diagnosis not present

## 2020-05-05 DIAGNOSIS — I480 Paroxysmal atrial fibrillation: Secondary | ICD-10-CM | POA: Diagnosis not present

## 2020-05-05 DIAGNOSIS — R0902 Hypoxemia: Secondary | ICD-10-CM | POA: Diagnosis not present

## 2020-05-05 DIAGNOSIS — M810 Age-related osteoporosis without current pathological fracture: Secondary | ICD-10-CM | POA: Diagnosis not present

## 2020-05-05 DIAGNOSIS — Z87311 Personal history of (healed) other pathological fracture: Secondary | ICD-10-CM | POA: Diagnosis not present

## 2020-05-05 DIAGNOSIS — J90 Pleural effusion, not elsewhere classified: Secondary | ICD-10-CM | POA: Diagnosis not present

## 2020-05-05 DIAGNOSIS — I4891 Unspecified atrial fibrillation: Secondary | ICD-10-CM | POA: Diagnosis not present

## 2020-05-05 DIAGNOSIS — I313 Pericardial effusion (noninflammatory): Secondary | ICD-10-CM | POA: Diagnosis not present

## 2020-05-05 DIAGNOSIS — G8929 Other chronic pain: Secondary | ICD-10-CM | POA: Diagnosis not present

## 2020-05-05 DIAGNOSIS — R279 Unspecified lack of coordination: Secondary | ICD-10-CM | POA: Diagnosis not present

## 2020-05-05 DIAGNOSIS — Z743 Need for continuous supervision: Secondary | ICD-10-CM | POA: Diagnosis not present

## 2020-05-05 DIAGNOSIS — Z8673 Personal history of transient ischemic attack (TIA), and cerebral infarction without residual deficits: Secondary | ICD-10-CM | POA: Diagnosis not present

## 2020-05-05 DIAGNOSIS — Z853 Personal history of malignant neoplasm of breast: Secondary | ICD-10-CM | POA: Diagnosis not present

## 2020-05-05 DIAGNOSIS — I314 Cardiac tamponade: Secondary | ICD-10-CM | POA: Diagnosis not present

## 2020-05-05 DIAGNOSIS — I2510 Atherosclerotic heart disease of native coronary artery without angina pectoris: Secondary | ICD-10-CM | POA: Diagnosis not present

## 2020-05-05 DIAGNOSIS — Z981 Arthrodesis status: Secondary | ICD-10-CM | POA: Diagnosis not present

## 2020-05-05 DIAGNOSIS — I4892 Unspecified atrial flutter: Secondary | ICD-10-CM | POA: Diagnosis not present

## 2020-05-05 DIAGNOSIS — M1990 Unspecified osteoarthritis, unspecified site: Secondary | ICD-10-CM | POA: Diagnosis not present

## 2020-05-06 DIAGNOSIS — I313 Pericardial effusion (noninflammatory): Secondary | ICD-10-CM | POA: Diagnosis not present

## 2020-05-06 DIAGNOSIS — Z87311 Personal history of (healed) other pathological fracture: Secondary | ICD-10-CM | POA: Diagnosis not present

## 2020-05-06 DIAGNOSIS — I480 Paroxysmal atrial fibrillation: Secondary | ICD-10-CM | POA: Diagnosis not present

## 2020-05-06 DIAGNOSIS — Z853 Personal history of malignant neoplasm of breast: Secondary | ICD-10-CM | POA: Diagnosis not present

## 2020-05-06 DIAGNOSIS — Z981 Arthrodesis status: Secondary | ICD-10-CM | POA: Diagnosis not present

## 2020-05-06 DIAGNOSIS — G8929 Other chronic pain: Secondary | ICD-10-CM | POA: Diagnosis not present

## 2020-05-06 DIAGNOSIS — M1990 Unspecified osteoarthritis, unspecified site: Secondary | ICD-10-CM | POA: Diagnosis not present

## 2020-05-06 DIAGNOSIS — Z8673 Personal history of transient ischemic attack (TIA), and cerebral infarction without residual deficits: Secondary | ICD-10-CM | POA: Diagnosis not present

## 2020-05-06 DIAGNOSIS — I509 Heart failure, unspecified: Secondary | ICD-10-CM | POA: Diagnosis not present

## 2020-05-06 DIAGNOSIS — G629 Polyneuropathy, unspecified: Secondary | ICD-10-CM | POA: Diagnosis not present

## 2020-05-06 DIAGNOSIS — J90 Pleural effusion, not elsewhere classified: Secondary | ICD-10-CM | POA: Diagnosis not present

## 2020-05-06 DIAGNOSIS — M810 Age-related osteoporosis without current pathological fracture: Secondary | ICD-10-CM | POA: Diagnosis not present

## 2020-05-06 DIAGNOSIS — I314 Cardiac tamponade: Secondary | ICD-10-CM | POA: Diagnosis not present

## 2020-05-06 DIAGNOSIS — I2510 Atherosclerotic heart disease of native coronary artery without angina pectoris: Secondary | ICD-10-CM | POA: Diagnosis not present

## 2020-06-06 DEATH — deceased
